# Patient Record
Sex: Female | Born: 1945 | Race: White | Hispanic: No | Marital: Married | Smoking: Never smoker
Health system: Southern US, Community
[De-identification: ages and names within clinical notes are randomized; demographics above are authoritative.]

## PROBLEM LIST (undated history)

## (undated) DIAGNOSIS — I10 Essential (primary) hypertension: Secondary | ICD-10-CM

## (undated) DIAGNOSIS — K5790 Diverticulosis of intestine, part unspecified, without perforation or abscess without bleeding: Secondary | ICD-10-CM

## (undated) DIAGNOSIS — A4902 Methicillin resistant Staphylococcus aureus infection, unspecified site: Secondary | ICD-10-CM

## (undated) DIAGNOSIS — M8580 Other specified disorders of bone density and structure, unspecified site: Secondary | ICD-10-CM

## (undated) HISTORY — DX: Methicillin resistant Staphylococcus aureus infection, unspecified site: A49.02

## (undated) HISTORY — DX: Essential (primary) hypertension: I10

## (undated) HISTORY — DX: Other specified disorders of bone density and structure, unspecified site: M85.80

## (undated) HISTORY — PX: HEMORRHOID SURGERY: SHX153

## (undated) HISTORY — DX: Diverticulosis of intestine, part unspecified, without perforation or abscess without bleeding: K57.90

---

## 1999-11-02 ENCOUNTER — Other Ambulatory Visit: Admission: RE | Admit: 1999-11-02 | Discharge: 1999-11-02 | Payer: Self-pay | Admitting: Gynecology

## 2001-10-02 ENCOUNTER — Other Ambulatory Visit: Admission: RE | Admit: 2001-10-02 | Discharge: 2001-10-02 | Payer: Self-pay | Admitting: Gynecology

## 2003-01-16 ENCOUNTER — Other Ambulatory Visit: Admission: RE | Admit: 2003-01-16 | Discharge: 2003-01-16 | Payer: Self-pay | Admitting: Gynecology

## 2004-08-16 ENCOUNTER — Other Ambulatory Visit: Admission: RE | Admit: 2004-08-16 | Discharge: 2004-08-16 | Payer: Self-pay | Admitting: Gynecology

## 2006-04-11 ENCOUNTER — Other Ambulatory Visit: Admission: RE | Admit: 2006-04-11 | Discharge: 2006-04-11 | Payer: Self-pay | Admitting: Gynecology

## 2011-05-23 ENCOUNTER — Ambulatory Visit (INDEPENDENT_AMBULATORY_CARE_PROVIDER_SITE_OTHER): Payer: BC Managed Care – PPO | Admitting: Internal Medicine

## 2011-05-23 ENCOUNTER — Telehealth: Payer: Self-pay | Admitting: Emergency Medicine

## 2011-05-23 ENCOUNTER — Encounter: Payer: Self-pay | Admitting: Internal Medicine

## 2011-05-23 ENCOUNTER — Other Ambulatory Visit: Payer: Self-pay | Admitting: Internal Medicine

## 2011-05-23 VITALS — BP 120/82 | HR 74 | Temp 97.2°F | Resp 14 | Ht 63.25 in | Wt 182.1 lb

## 2011-05-23 DIAGNOSIS — K649 Unspecified hemorrhoids: Secondary | ICD-10-CM | POA: Insufficient documentation

## 2011-05-23 DIAGNOSIS — K922 Gastrointestinal hemorrhage, unspecified: Secondary | ICD-10-CM

## 2011-05-23 LAB — CBC WITH DIFFERENTIAL/PLATELET
Basophils Absolute: 0 10*3/uL (ref 0.0–0.1)
Basophils Relative: 1 % (ref 0–1)
Eosinophils Absolute: 0.2 10*3/uL (ref 0.0–0.7)
Hemoglobin: 13.2 g/dL (ref 12.0–15.0)
MCH: 30.5 pg (ref 26.0–34.0)
MCHC: 32.6 g/dL (ref 30.0–36.0)
Monocytes Relative: 9 % (ref 3–12)
Neutrophils Relative %: 59 % (ref 43–77)
RDW: 14.4 % (ref 11.5–15.5)

## 2011-05-23 LAB — COMPREHENSIVE METABOLIC PANEL
AST: 18 U/L (ref 0–37)
Alkaline Phosphatase: 56 U/L (ref 39–117)
BUN: 19 mg/dL (ref 6–23)
Glucose, Bld: 89 mg/dL (ref 70–99)
Sodium: 136 mEq/L (ref 135–145)
Total Bilirubin: 0.3 mg/dL (ref 0.3–1.2)
Total Protein: 7.9 g/dL (ref 6.0–8.3)

## 2011-05-23 MED ORDER — HYDROCORTISONE ACETATE 30 MG RE SUPP: RECTAL | Status: DC

## 2011-05-23 NOTE — Progress Notes (Signed)
Subjective:     Patient ID: Robin Massey, female   DOB: [birth year], 65 y.o.   MRN: [id number]  HPI 1)First visit.  Former pt.  Healthy female with PMH of osteopenia and MRSA successfully treated with Doxycycline.  2)  She has been having trouble with hemorrhoids.  Bleeds about every 3rd day.  Tucks not helping.  Constipation a problem at times, she uses fiber.  Last colonoscopy 2003 pt reports no polyps.  ??Dr. Juanda Chance  No weight loss, no appetite change, no N/V     Allergies  Allergen Reactions  . Penicillins Other (See Comments)    Reaction as a child   Past Medical History  Diagnosis Date  . MRSA (methicillin resistant Staphylococcus aureus)   . Osteopenia    History reviewed. No pertinent past surgical history. History   Social History  . Marital Status: Married    Spouse Name: N/A    Number of Children: N/A  . Years of Education: N/A   Occupational History  . Not on file.   Social History Main Topics  . Smoking status: Never Smoker   . Smokeless tobacco: Never Used  . Alcohol Use: 0.5 oz/week    1 drink(s) per week  . Drug Use: No  . Sexually Active: Yes -- Female partner(s)   Other Topics Concern  . Not on file   Social History Narrative  . No narrative on file   Family History  Problem Relation Age of Onset  . Osteoporosis Mother   . Dementia Mother   . Heart disease Father     pacemaker heart valve replacement   There is no problem list on file for this patient.     Review of Systems See HPI    Objective:   Physical Exam  Constitutional: She appears well-developed and well-nourished.  Cardiovascular: Normal rate, regular rhythm and normal heart sounds.        No LE edema bilaterally  Pulmonary/Chest: Breath sounds normal.  Abdominal: She exhibits no distension. There is no tenderness.  Genitourinary: Rectal exam shows external hemorrhoid. Rectal exam shows no mass. Guaiac negative stool.       Assessment:     1) Hemorrhoids:  Will  give Proctocort suppository for the next two week.  I counseled pt. To make appt with her GI MD for earlier colonoscopy and discussion of treatment of hemorrhoids  2) GI bleed   I counseled pt. To make appt with her GI MD for earlier colonoscopy and discussion of treatment of hemorrhoids  3) H/O MRSA  4) Osteopenia      Plan:      1) Will give Proctocort suppository for the next two week.  I counseled pt. To make appt with her GI MD for earlier colonoscopy and discussion of treatment of hemorrhoids  2)  Will initiate GI Referral

## 2011-05-23 NOTE — Telephone Encounter (Signed)
Pt called, stated her prescription did not go through to [facility].  Script for [employer] as written in note per DDS called to [place], RPh at [facility], rn

## 2011-05-24 ENCOUNTER — Telehealth: Payer: Self-pay | Admitting: Internal Medicine

## 2011-05-24 ENCOUNTER — Encounter: Payer: Self-pay | Admitting: *Deleted

## 2011-05-24 ENCOUNTER — Encounter: Payer: Self-pay | Admitting: Emergency Medicine

## 2011-05-24 NOTE — Telephone Encounter (Signed)
Spoke with Selena Batten and gave her the appointment information. She will call patient. Letter mailed to patient.

## 2011-05-24 NOTE — Telephone Encounter (Signed)
Scheduled patient on 06/06/11 arrive at 1:45 PM for 2:00 PM OV with Dr. Juanda Chance. Line is busy for [employer]. Will try again later.

## 2011-05-25 ENCOUNTER — Encounter: Payer: Self-pay | Admitting: Emergency Medicine

## 2011-06-06 ENCOUNTER — Ambulatory Visit: Payer: BC Managed Care – PPO | Admitting: Internal Medicine

## 2011-06-12 ENCOUNTER — Encounter: Payer: Self-pay | Admitting: Internal Medicine

## 2011-06-12 DIAGNOSIS — M8580 Other specified disorders of bone density and structure, unspecified site: Secondary | ICD-10-CM | POA: Insufficient documentation

## 2011-06-12 DIAGNOSIS — E663 Overweight: Secondary | ICD-10-CM | POA: Insufficient documentation

## 2011-06-12 DIAGNOSIS — Z8614 Personal history of Methicillin resistant Staphylococcus aureus infection: Secondary | ICD-10-CM | POA: Insufficient documentation

## 2011-06-12 DIAGNOSIS — E785 Hyperlipidemia, unspecified: Secondary | ICD-10-CM | POA: Insufficient documentation

## 2011-06-13 ENCOUNTER — Encounter: Payer: Self-pay | Admitting: Internal Medicine

## 2011-06-13 DIAGNOSIS — M8580 Other specified disorders of bone density and structure, unspecified site: Secondary | ICD-10-CM | POA: Insufficient documentation

## 2011-06-17 ENCOUNTER — Encounter: Payer: Self-pay | Admitting: Internal Medicine

## 2011-06-23 ENCOUNTER — Ambulatory Visit: Payer: BC Managed Care – PPO | Admitting: Internal Medicine

## 2011-06-27 ENCOUNTER — Ambulatory Visit: Payer: BC Managed Care – PPO | Admitting: Internal Medicine

## 2011-08-05 ENCOUNTER — Encounter: Payer: Self-pay | Admitting: Internal Medicine

## 2011-08-19 ENCOUNTER — Other Ambulatory Visit (INDEPENDENT_AMBULATORY_CARE_PROVIDER_SITE_OTHER): Payer: BC Managed Care – PPO

## 2011-08-19 ENCOUNTER — Ambulatory Visit (INDEPENDENT_AMBULATORY_CARE_PROVIDER_SITE_OTHER): Payer: BC Managed Care – PPO | Admitting: Internal Medicine

## 2011-08-19 ENCOUNTER — Encounter: Payer: Self-pay | Admitting: Internal Medicine

## 2011-08-19 DIAGNOSIS — K625 Hemorrhage of anus and rectum: Secondary | ICD-10-CM

## 2011-08-19 DIAGNOSIS — K648 Other hemorrhoids: Secondary | ICD-10-CM

## 2011-08-19 DIAGNOSIS — K649 Unspecified hemorrhoids: Secondary | ICD-10-CM

## 2011-08-19 LAB — CBC WITH DIFFERENTIAL/PLATELET
Basophils Absolute: 0 10*3/uL (ref 0.0–0.1)
Basophils Relative: 0.8 % (ref 0.0–3.0)
Eosinophils Absolute: 0.2 10*3/uL (ref 0.0–0.7)
Lymphocytes Relative: 26.2 % (ref 12.0–46.0)
MCHC: 33.8 g/dL (ref 30.0–36.0)
Monocytes Relative: 7 % (ref 3.0–12.0)
Neutrophils Relative %: 62.7 % (ref 43.0–77.0)
RBC: 4.09 Mil/uL (ref 3.87–5.11)
WBC: 5.1 10*3/uL (ref 4.5–10.5)

## 2011-08-19 MED ORDER — PEG-KCL-NACL-NASULF-NA ASC-C 100 G PO SOLR: 1.0000 | Freq: Once | ORAL | Status: DC

## 2011-08-19 MED ORDER — HYDROCORTISONE ACE-PRAMOXINE 2.5-1 % RE CREA: TOPICAL_CREAM | Freq: Three times a day (TID) | RECTAL | Status: AC | PRN

## 2011-08-19 MED ORDER — HYDROCORTISONE ACETATE 25 MG RE SUPP: 25.0000 mg | Freq: Every day | RECTAL | Status: AC

## 2011-08-19 NOTE — Progress Notes (Signed)
Robin Massey [birth year] MRN [id number]    History of Present Illness:  This is a 65 year old white female with pain and small amounts of blood in the stool as well as in the water. This has been occurring intermittently for one year. She attributes bleeding to hemorrhoids. She had a screening colonoscopy in 2003 with findings of diverticulosis of the sigmoid colon. There is no family history of colon cancer. She has been somewhat constipated. She takes stool softeners.   Past Medical History  Diagnosis Date  . MRSA (methicillin resistant Staphylococcus aureus)   . Osteopenia   . Hemorrhoids   . Diverticulosis    History reviewed. No pertinent past surgical history.  reports that she has never smoked. She has never used smokeless tobacco. She reports that she drinks about .5 ounces of alcohol per week. She reports that she does not use illicit drugs. family history includes Dementia in her mother; Heart disease in her father; and Osteoporosis in her mother. Allergies  Allergen Reactions  . Penicillins Other (See Comments)    Reaction as a child        Review of Systems: Denies abdominal pain heartburn dysphagia shortness of breath or chest pain  The remainder of the 10 point ROS is negative except as outlined in H&P   Physical Exam: General appearance  Well developed, in no distress. Eyes- non icteric. HEENT nontraumatic, normocephalic. Mouth no lesions, tongue papillated, no cheilosis. Neck supple without adenopathy, thyroid not enlarged, no carotid bruits, no JVD. Lungs Clear to auscultation bilaterally. Cor normal S1, normal S2, regular rhythm, no murmur,  quiet precordium. Abdomen: Soft nontender abdomen with normal active bowel sounds. Mildly protuberant. Liver edge at costal margin. Rectal: And anoscopic exam reveals large external hemorrhoids with bright red blood at the entrance into the anal canal. Thrombosed hemorrhoids noted in the anal canal which is oozing  blood. There were no significant internal hemorrhoids. Extremities no pedal edema. Skin no lesions. Neurological alert and oriented x 3. Psychological normal mood and affect.  Assessment and Plan:  Problem #1 painless hematochezia almost certainly due to external hemorrhoids. There are thrombosed hemorrhoids in the anal canal. We will treat her conservatively with Anusol-HC suppositories at bedtime and Analpram cream 2.5% as well as fiber supplements and stool softeners. She is due for a recall colonoscopy to rule out additional lesions in her colon. We will reexamine her in 6 weeks and decide if a surgical hemorrhoidectomy is indicated. We are checking a CBC today.   08/19/2011 [name]

## 2011-08-19 NOTE — Patient Instructions (Signed)
You have been scheduled for a colonoscopy. Please follow written instructions given to you at your visit today.  Please pick up your Moviprep kit at the pharmacy within the next 2-3 days. We have sent the following medications to your pharmacy for you to pick up at your convenience: Ansuol [facility] Analpram Your physician has requested that you go to the basement for the following lab work before leaving today: CBC CC: Dr Constance Goltz

## 2011-08-22 ENCOUNTER — Telehealth: Payer: Self-pay

## 2011-08-22 NOTE — Telephone Encounter (Signed)
Pt aware.

## 2011-08-22 NOTE — Telephone Encounter (Signed)
Message copied by Michele Mcalpine on Mon Aug 22, 2011  8:18 AM ------      Message from: Hart Carwin      Created: Sat Aug 20, 2011  3:21 PM       Please call pt with normal CBC

## 2011-09-22 ENCOUNTER — Ambulatory Visit (AMBULATORY_SURGERY_CENTER): Payer: BC Managed Care – PPO | Admitting: Internal Medicine

## 2011-09-22 ENCOUNTER — Encounter: Payer: Self-pay | Admitting: Internal Medicine

## 2011-09-22 DIAGNOSIS — Z1211 Encounter for screening for malignant neoplasm of colon: Secondary | ICD-10-CM

## 2011-09-22 DIAGNOSIS — K625 Hemorrhage of anus and rectum: Secondary | ICD-10-CM

## 2011-09-22 DIAGNOSIS — K649 Unspecified hemorrhoids: Secondary | ICD-10-CM

## 2011-09-22 DIAGNOSIS — K648 Other hemorrhoids: Secondary | ICD-10-CM

## 2011-09-22 MED ORDER — SODIUM CHLORIDE 0.9 % IV SOLN: 500.0000 mL | INTRAVENOUS | Status: DC

## 2011-09-22 NOTE — Patient Instructions (Signed)
Please refer to your blue and neon green sheets for instructions regarding diet and activity for the rest of today.  You may resume your medications as you would normally take them.   Diverticulosis Diverticulosis is a common condition that develops when small pouches (diverticula) form in the wall of the colon. The risk of diverticulosis increases with age. It happens more often in people who eat a low-fiber diet. Most individuals with diverticulosis have no symptoms. Those individuals with symptoms usually experience abdominal pain, constipation, or loose stools (diarrhea). HOME CARE INSTRUCTIONS   Increase the amount of fiber in your diet as directed by your caregiver or dietician. This may reduce symptoms of diverticulosis.   Your caregiver may recommend taking a dietary fiber supplement.   Drink at least 6 to 8 glasses of water each day to prevent constipation.   Try not to strain when you have a bowel movement.   Your caregiver may recommend avoiding nuts and seeds to prevent complications, although this is still an uncertain benefit.   Only take over-the-counter or prescription medicines for pain, discomfort, or fever as directed by your caregiver.  FOODS WITH HIGH FIBER CONTENT INCLUDE:  Fruits. Apple, peach, pear, tangerine, raisins, prunes.   Vegetables. Brussels sprouts, asparagus, broccoli, cabbage, carrot, cauliflower, romaine lettuce, spinach, summer squash, tomato, winter squash, zucchini.   Starchy Vegetables. Baked beans, kidney beans, lima beans, split peas, lentils, potatoes (with skin).   Grains. Whole wheat bread, brown rice, bran flake cereal, plain oatmeal, white rice, shredded wheat, bran muffins.  SEEK IMMEDIATE MEDICAL CARE IF:   You develop increasing pain or severe bloating.   You have an oral temperature above 102 F (38.9 C), not controlled by medicine.   You develop vomiting or bowel movements that are bloody or black.  Document Released: 07/28/2004  Document Revised: 07/13/2011 Document Reviewed: 03/31/2010 [facility] Patient Information 2012 [place], [place].  Hemorrhoids Hemorrhoids are veins in the rectum that get big. These veins can get blocked. Blocked veins become puffy (swollen) and painful. HOME CARE  Eat more fiber.   Drink enough fluid to keep your pee (urine) clear or pale yellow.   Exercise often.   Avoid straining to poop (bowel movement).   Keep the butt area dry and clean.   Only take medicine as told by your doctor.  If your hemorrhoids are puffy and painful:  Take a warm bath for 20 to 30 minutes. Do this 3 to 4 times a day.   Place ice packs on the area. Use the ice packs between the baths.   Put ice in a plastic bag.   Place a towel between your skin and the bag.   Leave the ice on for 15 to 20 minutes, 3 to 4 times a day.   Do not use a donut-shaped pillow. Do not sit on the toilet for a long time.   Go to the bathroom when your body has the urge to poop. This is so you do not strain as much to poop.  GET HELP RIGHT AWAY IF:   You have increasing pain that is not controlled with medicine.   You have uncontrolled bleeding.   You cannot poop.   You have pain or puffiness outside the area of the hemorrhoids.   You have chills.   You have a temperature by mouth above 102 F (38.9 C), not controlled by medicine.  MAKE SURE YOU:   Understand these instructions.   Will watch your condition.   Will  get help right away if you are not doing well or get worse.  Document Released: 08/09/2008 Document Revised: 07/13/2011 Document Reviewed: 08/09/2008 [facility] Patient Information 2012 [place], [place].

## 2011-09-23 ENCOUNTER — Telehealth: Payer: Self-pay | Admitting: *Deleted

## 2011-09-23 NOTE — Telephone Encounter (Signed)

## 2011-09-29 ENCOUNTER — Telehealth: Payer: Self-pay | Admitting: Internal Medicine

## 2011-09-29 NOTE — Telephone Encounter (Signed)
Spoke with patient and she is going to her PCP on Tuesday. She wants to talk with her PCP about a Careers adviser. She will call us back after this to schedule.

## 2011-09-29 NOTE — Telephone Encounter (Signed)
Spoke with patient and she states she had her colonoscopy last week. She is using the suppositories for her hemorrhoids but they are still bothering her. She wants to know if she should she see Dr. Juanda Chance or surgeon about them. Please, advise.

## 2011-09-29 NOTE — Telephone Encounter (Signed)
She should see a surgeon for her hems because they are mostly external

## 2011-09-29 NOTE — Telephone Encounter (Signed)
Left a message for patient to call me. 

## 2011-10-04 ENCOUNTER — Ambulatory Visit (INDEPENDENT_AMBULATORY_CARE_PROVIDER_SITE_OTHER): Payer: BC Managed Care – PPO | Admitting: Internal Medicine

## 2011-10-04 ENCOUNTER — Encounter: Payer: Self-pay | Admitting: Internal Medicine

## 2011-10-04 ENCOUNTER — Ambulatory Visit (HOSPITAL_BASED_OUTPATIENT_CLINIC_OR_DEPARTMENT_OTHER)
Admission: RE | Admit: 2011-10-04 | Discharge: 2011-10-04 | Disposition: A | Discharge status: Home / Self Care | Payer: BC Managed Care – PPO | Source: Ambulatory Visit | Attending: Internal Medicine | Admitting: Internal Medicine

## 2011-10-04 DIAGNOSIS — Z23 Encounter for immunization: Secondary | ICD-10-CM

## 2011-10-04 DIAGNOSIS — R05 Cough: Secondary | ICD-10-CM

## 2011-10-04 DIAGNOSIS — B359 Dermatophytosis, unspecified: Secondary | ICD-10-CM | POA: Insufficient documentation

## 2011-10-04 DIAGNOSIS — Z1151 Encounter for screening for human papillomavirus (HPV): Secondary | ICD-10-CM

## 2011-10-04 DIAGNOSIS — Z01419 Encounter for gynecological examination (general) (routine) without abnormal findings: Secondary | ICD-10-CM

## 2011-10-04 DIAGNOSIS — M8580 Other specified disorders of bone density and structure, unspecified site: Secondary | ICD-10-CM

## 2011-10-04 DIAGNOSIS — K5790 Diverticulosis of intestine, part unspecified, without perforation or abscess without bleeding: Secondary | ICD-10-CM | POA: Insufficient documentation

## 2011-10-04 DIAGNOSIS — Z1272 Encounter for screening for malignant neoplasm of vagina: Secondary | ICD-10-CM

## 2011-10-04 DIAGNOSIS — M899 Disorder of bone, unspecified: Secondary | ICD-10-CM

## 2011-10-04 DIAGNOSIS — R059 Cough, unspecified: Secondary | ICD-10-CM | POA: Insufficient documentation

## 2011-10-04 DIAGNOSIS — K649 Unspecified hemorrhoids: Secondary | ICD-10-CM

## 2011-10-04 DIAGNOSIS — E785 Hyperlipidemia, unspecified: Secondary | ICD-10-CM

## 2011-10-04 DIAGNOSIS — Z139 Encounter for screening, unspecified: Secondary | ICD-10-CM

## 2011-10-04 DIAGNOSIS — K5730 Diverticulosis of large intestine without perforation or abscess without bleeding: Secondary | ICD-10-CM

## 2011-10-04 DIAGNOSIS — Z0000 Encounter for general adult medical examination without abnormal findings: Secondary | ICD-10-CM

## 2011-10-04 DIAGNOSIS — J309 Allergic rhinitis, unspecified: Secondary | ICD-10-CM | POA: Insufficient documentation

## 2011-10-04 LAB — POCT URINALYSIS DIPSTICK
Bilirubin, UA: NEGATIVE
Glucose, UA: NEGATIVE
Ketones, UA: NEGATIVE
Nitrite, UA: NEGATIVE
Spec Grav, UA: <=1.005
Urobilinogen, UA: NEGATIVE

## 2011-10-04 LAB — COMPREHENSIVE METABOLIC PANEL
Albumin: 4 g/dL (ref 3.5–5.2)
CO2: 26 mEq/L (ref 19–32)
Calcium: 9.1 mg/dL (ref 8.4–10.5)
Chloride: 102 mEq/L (ref 96–112)
Glucose, Bld: 88 mg/dL (ref 70–99)
Potassium: 4.3 mEq/L (ref 3.5–5.3)
Sodium: 137 mEq/L (ref 135–145)
Total Protein: 7.9 g/dL (ref 6.0–8.3)

## 2011-10-04 LAB — CBC WITH DIFFERENTIAL/PLATELET
Eosinophils Absolute: 0.3 10*3/uL (ref 0.0–0.7)
Hemoglobin: 13.4 g/dL (ref 12.0–15.0)
Lymphs Abs: 1.6 10*3/uL (ref 0.7–4.0)
MCH: 31.5 pg (ref 26.0–34.0)
Monocytes Relative: 6 % (ref 3–12)
Neutro Abs: 2.9 10*3/uL (ref 1.7–7.7)
Neutrophils Relative %: 56 % (ref 43–77)
RBC: 4.26 MIL/uL (ref 3.87–5.11)

## 2011-10-04 LAB — TSH: TSH: 1.919 u[IU]/mL (ref 0.350–4.500)

## 2011-10-04 LAB — LIPID PANEL: Cholesterol: 195 mg/dL (ref 0–200)

## 2011-10-04 MED ORDER — NYSTATIN-TRIAMCINOLONE 100000-0.1 UNIT/GM-% EX OINT: TOPICAL_OINTMENT | Freq: Two times a day (BID) | CUTANEOUS | Status: AC

## 2011-10-04 MED ORDER — FLUTICASONE PROPIONATE 50 MCG/ACT NA SUSP: 1.0000 | Freq: Every day | NASAL | Status: DC

## 2011-10-04 NOTE — Patient Instructions (Signed)
To X-ray now for mammogram and Chest x ray  Use antifungal creme under breast 2 times daily  claritin D once a day and flonase nasal spray once a day  See me in 3 months

## 2011-10-04 NOTE — Progress Notes (Signed)
Subjective:    Patient ID: Robin Massey, female    DOB: 07-22-1946, 65 y.o.   MRN: [id number]  HPI  Robin Massey is here for comprehensive eval.  Overall doing well.  She does have an itchy rash under both breasts.    She also reports an intermittant cough for the past 4 weeks off and on.  She does feel a lot of posterior pharyngeal drainange.   She denies wheezing or sob or chest pain  She had recent colonosocopy with Dr. Juanda Chance which revealed hemorrhoids and diverticulosis.  She would like to talk to a surgeon as her hemorrhoids still bleed off and on.  She has no abd painor bleedingnow  Allergies  Allergen Reactions  . Penicillins Other (See Comments)    Reaction as a child   Past Medical History  Diagnosis Date  . MRSA (methicillin resistant Staphylococcus aureus)   . Osteopenia   . Hemorrhoids   . Diverticulosis    No past surgical history on file. History   Social History  . Marital Status: Married    Spouse Name: N/A    Number of Children: 2  . Years of Education: N/A   Occupational History  . retired    Social History Main Topics  . Smoking status: Never Smoker   . Smokeless tobacco: Never Used  . Alcohol Use: 0.5 oz/week    1 drink(s) per week     occasionally  . Drug Use: No  . Sexually Active: Yes -- Female partner(s)   Other Topics Concern  . Not on file   Social History Narrative  . No narrative on file   Family History  Problem Relation Age of Onset  . Osteoporosis Mother   . Dementia Mother   . Heart disease Father     pacemaker heart valve replacement   Patient Active Problem List  Diagnoses  . Hemorrhoids  . Other and unspecified hyperlipidemia  . Overweight  . Hx MRSA infection  . Osteopenia   Current Outpatient Prescriptions on File Prior to Visit  Medication Sig Dispense Refill  . b complex vitamins capsule Take 1 capsule by mouth daily.        . Calcium Carbonate-Vitamin D (CALTRATE 600+D) 600-400 MG-UNIT per tablet Take 1  tablet by mouth daily.        Marland Kitchen co-enzyme Q-10 30 MG capsule Take 30 mg by mouth daily.        . Cyanocobalamin (VITAMIN B-12 CR PO) Take 1 tablet by mouth daily.        Marland Kitchen GRAPE SEED CR PO Take 1 tablet by mouth daily.        . hydrocortisone (ANUSOL-HC) 25 MG suppository Place 1 suppository (25 mg total) rectally at bedtime.  12 suppository  1  . hydrocortisone-pramoxine (ANALPRAM-HC) 2.5-1 % rectal cream       . Multiple Vitamin (MULTIVITAMIN) tablet Take 1 tablet by mouth daily.        Marland Kitchen VITAMIN D, CHOLECALCIFEROL, PO Take 1 tablet by mouth daily.        . vitamin E 400 UNIT capsule Take 400 Units by mouth daily.            Review of Systems    No dyspepsia, no dysuria or urgency or incontinence See HPI Objective:   Physical Exam Physical Exam  Vital signs and nursing note reviewed  Constitutional: She is oriented to person, place, and time. She appears well-developed and well-nourished. She is cooperative.  HENT:  Head:  Normocephalic and atraumatic.  Right Ear: Tympanic membrane normal.  Left Ear: Tympanic membrane normal.  Nose: Nose normal.  Mouth/Throat: Oropharynx is clear and moist and mucous membranes are normal. No oropharyngeal exudate or posterior oropharyngeal erythema.  Eyes: Conjunctivae and EOM are normal. Pupils are equal, round, and reactive to light.  Neck: Neck supple. No JVD present. Carotid bruit is not present. No mass and no thyromegaly present.  Cardiovascular: Regular rhythm, normal heart sounds, intact distal pulses and normal pulses.  Exam reveals no gallop and no friction rub.   No murmur heard. Pulses:      Dorsalis pedis pulses are 2+ on the right side, and 2+ on the left side.  Pulmonary/Chest: Breath sounds normal. She has no wheezes. She has no rhonchi. She has no rales. Right breast exhibits no mass, no nipple discharge and no skin change. Left breast exhibits no mass, no nipple discharge and no skin change.  Abdominal: Soft. Bowel sounds are  normal. She exhibits no distension and no mass. There is no hepatosplenomegaly. There is no tenderness. There is no CVA tenderness.  Genitourinary: Rectum normal, vagina normal and uterus normal. Rectal exam shows no mass. Guaiac negative stool. No labial fusion. There is no lesion on the right labia. There is no lesion on the left labia. Cervix exhibits no motion tenderness. Right adnexum displays no mass, no tenderness and no fullness. Left adnexum displays no mass, no tenderness and no fullness. No erythema around the vagina.  Musculoskeletal:       No active synovitis to any joint.    Lymphadenopathy:       Right cervical: No superficial cervical adenopathy present.      Left cervical: No superficial cervical adenopathy present.       Right axillary: No pectoral and no lateral adenopathy present.       Left axillary: No pectoral and no lateral adenopathy present.      Right: No inguinal adenopathy present.       Left: No inguinal adenopathy present.  Neurological: She is alert and oriented to person, place, and time. She has normal strength and normal reflexes. No cranial nerve deficit or sensory deficit. She displays a negative Romberg sign. Coordination and gait normal.  Skin: Skin is warm and dry. No abrasion, no bruising, no ecchymosis and no rash noted. No cyanosis. Nails show no clubbing.  Psychiatric: She has a normal mood and affect. Her speech is normal and behavior is normal.          Assessment & Plan:  1)  Health Maintenance  Flu and pneumonia vaccine today.  She will get mammmogram today.  See scanned health matinenance sheet.  EKG SR no acute changes.   Dash diet given 2)  Cough  :  May be due to allergic rhinitis.  Will try Claritin D and Flonase once daily 3)  Tinea:   Mycolog creme bid 4)  Hemorrhoids  Will refer to Dr. Abbey Chatters 5) hyperlipidemia  Check all labs today 6)  Osteopenia  Calcium and vit D  Check Vitamin D today        Assessment & Plan:

## 2011-10-10 ENCOUNTER — Telehealth: Payer: Self-pay | Admitting: Internal Medicine

## 2011-10-10 ENCOUNTER — Encounter: Payer: Self-pay | Admitting: Emergency Medicine

## 2011-10-10 NOTE — Telephone Encounter (Signed)
Spoke with pt .  She reports cough is nearly gone.  I also informed of pap smear results.    She is to call if cough returns

## 2011-10-14 ENCOUNTER — Ambulatory Visit (HOSPITAL_BASED_OUTPATIENT_CLINIC_OR_DEPARTMENT_OTHER)
Admission: RE | Admit: 2011-10-14 | Discharge: 2011-10-14 | Disposition: A | Discharge status: Home / Self Care | Payer: BC Managed Care – PPO | Source: Ambulatory Visit | Attending: Internal Medicine | Admitting: Internal Medicine

## 2011-10-14 DIAGNOSIS — Z1231 Encounter for screening mammogram for malignant neoplasm of breast: Secondary | ICD-10-CM | POA: Insufficient documentation

## 2011-10-14 DIAGNOSIS — Z139 Encounter for screening, unspecified: Secondary | ICD-10-CM

## 2011-11-01 ENCOUNTER — Ambulatory Visit (INDEPENDENT_AMBULATORY_CARE_PROVIDER_SITE_OTHER): Payer: BC Managed Care – PPO | Admitting: General Surgery

## 2011-11-01 ENCOUNTER — Encounter (INDEPENDENT_AMBULATORY_CARE_PROVIDER_SITE_OTHER): Payer: Self-pay | Admitting: General Surgery

## 2011-11-01 VITALS — BP 152/94 | HR 72 | Temp 97.8°F | Resp 18 | Ht 63.0 in | Wt 183.8 lb

## 2011-11-01 DIAGNOSIS — K648 Other hemorrhoids: Secondary | ICD-10-CM

## 2011-11-01 NOTE — Patient Instructions (Signed)
Call if you have further problems with your hemorrhoids.

## 2011-11-01 NOTE — Progress Notes (Signed)
Patient ID: Robin Massey, female   DOB: Dec 21, 1945, 65 y.o.   MRN: [id number]  Chief Complaint  Patient presents with  . Other    new pt- eval hems    HPI Robin Massey is a 65 y.o. female.   HPI  She is referred by Dr. Levon Hedger for evaluation of bleeding internal hemorrhoids. This is been documented by Dr. Juanda Chance in the past.  She has been taking some stool softeners and since that time has not been constipated has not had any further problems with her hemorrhoids. Sometimes she states the prolapse. Past Medical History  Diagnosis Date  . MRSA (methicillin resistant Staphylococcus aureus)   . Osteopenia   . Hemorrhoids   . Diverticulosis     Past Surgical History  Procedure Date  . Hemorrhoid surgery     Family History  Problem Relation Age of Onset  . Osteoporosis Mother   . Dementia Mother   . Heart disease Father     pacemaker heart valve replacement    Social History History  Substance Use Topics  . Smoking status: Never Smoker   . Smokeless tobacco: Never Used  . Alcohol Use: No     occasionally    Allergies  Allergen Reactions  . Penicillins Other (See Comments)    Reaction as a child    Current Outpatient Prescriptions  Medication Sig Dispense Refill  . aspirin EC 81 MG tablet Take 81 mg by mouth daily.        Marland Kitchen b complex vitamins capsule Take 1 capsule by mouth daily.        . Calcium Carbonate-Vitamin D (CALTRATE 600+D) 600-400 MG-UNIT per tablet Take 1 tablet by mouth daily.        Marland Kitchen co-enzyme Q-10 30 MG capsule Take 30 mg by mouth daily.        . Cyanocobalamin (VITAMIN B-12 CR PO) Take 1 tablet by mouth daily.        . fluticasone (FLONASE) 50 MCG/ACT nasal spray Place 1 spray into the nose daily.  1 g  1  . GRAPE SEED CR PO Take 1 tablet by mouth daily.        . hydrocortisone (ANUSOL-HC) 25 MG suppository Place 1 suppository (25 mg total) rectally at bedtime.  12 suppository  1  . Multiple Vitamin (MULTIVITAMIN) tablet Take  1 tablet by mouth daily.        Marland Kitchen nystatin-triamcinolone ointment (MYCOLOG) Apply topically 2 (two) times daily.  30 g  0  . VITAMIN D, CHOLECALCIFEROL, PO Take 1 tablet by mouth daily.        . vitamin E 400 UNIT capsule Take 800 Units by mouth daily.       . hydrocortisone-pramoxine (ANALPRAM-HC) 2.5-1 % rectal cream         Review of Systems Review of Systems  Gastrointestinal:       Intermittent constipation but none recently. No recent rectal bleeding    Blood pressure 152/94, pulse 72, temperature 97.8 F (36.6 C), temperature source Temporal, resp. rate 18, height 5\' 3"  (1.6 m), weight 183 lb 12.8 oz (83.371 kg).  Physical Exam Physical Exam  Constitutional: No distress.       Overweight   Genitourinary:       Multiple external perianal skin tags were seen. No fissures. No suspicious skin lesions.  No palpable masses or blood on digital rectal exam.  Anoscopy-moderate sized internal hemorrhoid right posterior position; small internal hemorrhoids right anterior position  in left lateral position. No blood.    Data Reviewed Dr. Regino Schultze notes.  Assessment    Bleeding and intermittently prolapsing internal hemorrhoids-no current problems at this time since her constipation has resolved. Constipation is certainly the stimulus that leads to complications from her internal hemorrhoids.    Plan    I do not feel she needs any intervention at this time since she is asymptomatic. Continue to avoid constipation. I told her to call she had recurrent prolapse or bleeding and we could try an in office treatment.       [name] 11/01/2011, 2:02 PM

## 2012-02-21 ENCOUNTER — Telehealth: Payer: Self-pay | Admitting: Internal Medicine

## 2012-02-21 NOTE — Telephone Encounter (Signed)
Please call pt and let her know that I received a letter from her eye doctor and I wouuld  Like to see her in office for office visit.,  Bp check and blood work  Message back with her appointment date  Thanks

## 2012-02-21 NOTE — Telephone Encounter (Signed)
Spoke with Robin Massey.  Scheduled appt Thurs 4/25 @ 945am per her request.  Advised her to come fasting for labs, she is agreeable.

## 2012-03-08 ENCOUNTER — Encounter: Payer: Self-pay | Admitting: Internal Medicine

## 2012-03-08 ENCOUNTER — Ambulatory Visit (INDEPENDENT_AMBULATORY_CARE_PROVIDER_SITE_OTHER): Payer: BC Managed Care – PPO | Admitting: Internal Medicine

## 2012-03-08 VITALS — BP 146/72 | HR 75 | Temp 97.7°F | Resp 16 | Ht 64.0 in | Wt 184.0 lb

## 2012-03-08 DIAGNOSIS — I10 Essential (primary) hypertension: Secondary | ICD-10-CM

## 2012-03-08 MED ORDER — AMLODIPINE BESYLATE 5 MG PO TABS: 5.0000 mg | ORAL_TABLET | Freq: Every day | ORAL | Status: DC

## 2012-03-08 NOTE — Patient Instructions (Signed)
Take Bp meds daily    See me in 2 mlonths

## 2012-03-08 NOTE — Progress Notes (Signed)
Subjective:    Patient ID: Robin Massey, female    DOB: [birth year], 66 y.o.   MRN: [id number]  HPI  Robin Massey is here for follow up for BP.  She is asymptomatic.  Se BP at Robin Massey office  SBP  152  No chest pain, no SOB, no Le edema, no headache  Allergies  Allergen Reactions  . Penicillins Other (See Comments)    Reaction as a child   Past Medical History  Diagnosis Date  . MRSA (methicillin resistant Staphylococcus aureus)   . Osteopenia   . Hemorrhoids   . Diverticulosis    Past Surgical History  Procedure Date  . Hemorrhoid surgery    History   Social History  . Marital Status: Married    Spouse Name: N/A    Number of Children: 2  . Years of Education: N/A   Occupational History  . retired    Social History Main Topics  . Smoking status: Never Smoker   . Smokeless tobacco: Never Used  . Alcohol Use: No     occasionally  . Drug Use: No  . Sexually Active: Yes -- Female partner(s)   Other Topics Concern  . Not on file   Social History Narrative  . No narrative on file   Family History  Problem Relation Age of Onset  . Osteoporosis Mother   . Dementia Mother   . Heart disease Father     pacemaker heart valve replacement   Patient Active Problem List  Diagnoses  . Hemorrhoids  . Other and unspecified hyperlipidemia  . Overweight  . Hx MRSA infection  . Osteopenia  . Tinea  . Allergic rhinitis  . Diverticulosis  . Essential hypertension, benign   Current Outpatient Prescriptions on File Prior to Visit  Medication Sig Dispense Refill  . aspirin EC 81 MG tablet Take 81 mg by mouth daily.        Marland Kitchen b complex vitamins capsule Take 1 capsule by mouth daily.        . Calcium Carbonate-Vitamin D (CALTRATE 600+D) 600-400 MG-UNIT per tablet Take 1 tablet by mouth daily.        Marland Kitchen co-enzyme Q-10 30 MG capsule Take 30 mg by mouth daily.        . Cyanocobalamin (VITAMIN B-12 CR PO) Take 1 tablet by mouth daily.        . fluticasone (FLONASE) 50  MCG/ACT nasal spray Place 1 spray into the nose daily.  1 g  1  . GRAPE SEED CR PO Take 1 tablet by mouth daily.        . hydrocortisone (ANUSOL-HC) 25 MG suppository Place 1 suppository (25 mg total) rectally at bedtime.  12 suppository  1  . hydrocortisone-pramoxine (ANALPRAM-HC) 2.5-1 % rectal cream       . Multiple Vitamin (MULTIVITAMIN) tablet Take 1 tablet by mouth daily.        Marland Kitchen nystatin-triamcinolone ointment (MYCOLOG) Apply topically 2 (two) times daily.  30 g  0  . VITAMIN D, CHOLECALCIFEROL, PO Take 1 tablet by mouth daily.        . vitamin E 400 UNIT capsule Take 800 Units by mouth daily.       Marland Kitchen amLODipine (NORVASC) 5 MG tablet Take 1 tablet (5 mg total) by mouth daily.  90 tablet  3      Review of Systems See HPI    Objective:   Physical Exam  Physical Exam  Nursing note and vitals reviewed.  Constitutional: She is oriented to person, place, and time. She appears well-developed and well-nourished.  HENT:  Head: Normocephalic and atraumatic.  Cardiovascular: Normal rate and regular rhythm. Exam reveals no gallop and no friction rub.  No murmur heard.  Pulmonary/Chest: Breath sounds normal. She has no wheezes. She has no rales.  Neurological: She is alert and oriented to person, place, and time.  Skin: Skin is warm and dry.  Psychiatric: She has a normal mood and affect. Her behavior is normal.       Assessment & Plan:  1)  New onset HTN;  Will begin amlodipine 5 mg daily.  Recheck 2 months 2)  Mild hyperlipidemia  Dash diet

## 2012-05-08 ENCOUNTER — Encounter: Payer: Self-pay | Admitting: Internal Medicine

## 2012-05-08 ENCOUNTER — Telehealth: Payer: Self-pay | Admitting: Internal Medicine

## 2012-05-08 ENCOUNTER — Ambulatory Visit (INDEPENDENT_AMBULATORY_CARE_PROVIDER_SITE_OTHER): Payer: BC Managed Care – PPO | Admitting: Internal Medicine

## 2012-05-08 VITALS — BP 118/70 | HR 68 | Temp 97.7°F | Resp 16 | Ht 63.0 in | Wt 180.0 lb

## 2012-05-08 DIAGNOSIS — I10 Essential (primary) hypertension: Secondary | ICD-10-CM

## 2012-05-08 DIAGNOSIS — E663 Overweight: Secondary | ICD-10-CM

## 2012-05-08 DIAGNOSIS — E785 Hyperlipidemia, unspecified: Secondary | ICD-10-CM

## 2012-05-08 MED ORDER — AMLODIPINE BESYLATE 5 MG PO TABS: 5.0000 mg | ORAL_TABLET | Freq: Every day | ORAL | Status: DC

## 2012-05-08 NOTE — Patient Instructions (Addendum)
Continue medicine daily

## 2012-05-08 NOTE — Progress Notes (Signed)
Subjective:    Patient ID: Robin Massey, female    DOB: 09-29-1946, 66 y.o.   MRN: [id number]  HPI Junious Dresser is here for follow up after initiating Norvasc 5 mg for HTN. She is tolerating well and no LE edema.  No headache no chest pain    Allergies  Allergen Reactions  . Penicillins Other (See Comments)    Reaction as a child   Past Medical History  Diagnosis Date  . MRSA (methicillin resistant Staphylococcus aureus)   . Osteopenia   . Hemorrhoids   . Diverticulosis    Past Surgical History  Procedure Date  . Hemorrhoid surgery    History   Social History  . Marital Status: Married    Spouse Name: N/A    Number of Children: 2  . Years of Education: N/A   Occupational History  . retired    Social History Main Topics  . Smoking status: Never Smoker   . Smokeless tobacco: Never Used  . Alcohol Use: No     occasionally  . Drug Use: No  . Sexually Active: Yes -- Female partner(s)   Other Topics Concern  . Not on file   Social History Narrative  . No narrative on file   Family History  Problem Relation Age of Onset  . Osteoporosis Mother   . Dementia Mother   . Heart disease Father     pacemaker heart valve replacement   Patient Active Problem List  Diagnosis  . Hemorrhoids  . Other and unspecified hyperlipidemia  . Overweight  . Hx MRSA infection  . Osteopenia  . Tinea  . Allergic rhinitis  . Diverticulosis  . Essential hypertension, benign   Current Outpatient Prescriptions on File Prior to Visit  Medication Sig Dispense Refill  . amLODipine (NORVASC) 5 MG tablet Take 1 tablet (5 mg total) by mouth daily.  90 tablet  3  . aspirin EC 81 MG tablet Take 81 mg by mouth daily.        Marland Kitchen b complex vitamins capsule Take 1 capsule by mouth daily.        . Calcium Carbonate-Vitamin D (CALTRATE 600+D) 600-400 MG-UNIT per tablet Take 1 tablet by mouth daily.        Marland Kitchen co-enzyme Q-10 30 MG capsule Take 30 mg by mouth daily.        . Cyanocobalamin (VITAMIN  B-12 CR PO) Take 1 tablet by mouth daily.        . fluticasone (FLONASE) 50 MCG/ACT nasal spray Place 1 spray into the nose daily.  1 g  1  . GRAPE SEED CR PO Take 1 tablet by mouth daily.        . hydrocortisone (ANUSOL-HC) 25 MG suppository Place 1 suppository (25 mg total) rectally at bedtime.  12 suppository  1  . hydrocortisone-pramoxine (ANALPRAM-HC) 2.5-1 % rectal cream       . Multiple Vitamin (MULTIVITAMIN) tablet Take 1 tablet by mouth daily.        Marland Kitchen nystatin-triamcinolone ointment (MYCOLOG) Apply topically 2 (two) times daily.  30 g  0  . VITAMIN D, CHOLECALCIFEROL, PO Take 1 tablet by mouth daily.        . vitamin E 400 UNIT capsule Take 800 Units by mouth daily.           Review of Systems    see HPI Objective:   Physical Exam Physical Exam  Nursing note and vitals reviewed.  Constitutional: She is oriented to person, place,  and time. She appears well-developed and well-nourished.  HENT:  Head: Normocephalic and atraumatic.  Cardiovascular: Normal rate and regular rhythm. Exam reveals no gallop and no friction rub.  No murmur heard.  Pulmonary/Chest: Breath sounds normal. She has no wheezes. She has no rales.  Neurological: She is alert and oriented to person, place, and time.  Skin: Skin is warm and dry.  Psychiatric: She has a normal mood and affect. Her behavior is normal.             Assessment & Plan:  HTNl;  Well controlled on low dose Norvasc 5 mg.  Will re-order

## 2012-05-08 NOTE — Telephone Encounter (Signed)
Called and want 4 months of meds

## 2012-05-16 ENCOUNTER — Other Ambulatory Visit: Payer: Self-pay | Admitting: *Deleted

## 2012-05-16 DIAGNOSIS — I10 Essential (primary) hypertension: Secondary | ICD-10-CM

## 2012-05-16 MED ORDER — AMLODIPINE BESYLATE 5 MG PO TABS: 5.0000 mg | ORAL_TABLET | Freq: Every day | ORAL | Status: DC

## 2012-05-16 NOTE — Telephone Encounter (Signed)
Pt wants a 4 month supply because she can get it cheaper than just a one month supply.  [facility] this special

## 2012-08-31 ENCOUNTER — Other Ambulatory Visit: Payer: Self-pay | Admitting: Internal Medicine

## 2012-08-31 DIAGNOSIS — Z1231 Encounter for screening mammogram for malignant neoplasm of breast: Secondary | ICD-10-CM

## 2012-10-08 ENCOUNTER — Telehealth: Payer: Self-pay | Admitting: *Deleted

## 2012-10-08 DIAGNOSIS — Z0000 Encounter for general adult medical examination without abnormal findings: Secondary | ICD-10-CM

## 2012-10-08 LAB — CBC WITH DIFFERENTIAL/PLATELET
Basophils Absolute: 0 10*3/uL (ref 0.0–0.1)
Basophils Relative: 0 % (ref 0–1)
HCT: 38.1 % (ref 36.0–46.0)
Lymphocytes Relative: 29 % (ref 12–46)
MCHC: 34.6 g/dL (ref 30.0–36.0)
Monocytes Absolute: 0.5 10*3/uL (ref 0.1–1.0)
Neutro Abs: 3.4 10*3/uL (ref 1.7–7.7)
Neutrophils Relative %: 59 % (ref 43–77)
RDW: 13.7 % (ref 11.5–15.5)
WBC: 5.7 10*3/uL (ref 4.0–10.5)

## 2012-10-08 LAB — LIPID PANEL
Cholesterol: 208 mg/dL — ABNORMAL HIGH (ref 0–200)
HDL: 56 mg/dL (ref >39)
LDL Cholesterol: 115 mg/dL — ABNORMAL HIGH (ref 0–99)
Triglycerides: 187 mg/dL — ABNORMAL HIGH (ref <150)

## 2012-10-08 LAB — COMPREHENSIVE METABOLIC PANEL
Albumin: 4.3 g/dL (ref 3.5–5.2)
Alkaline Phosphatase: 62 U/L (ref 39–117)
BUN: 17 mg/dL (ref 6–23)
CO2: 29 mEq/L (ref 19–32)
Calcium: 9.5 mg/dL (ref 8.4–10.5)
Chloride: 103 mEq/L (ref 96–112)
Glucose, Bld: 95 mg/dL (ref 70–99)
Potassium: 4.4 mEq/L (ref 3.5–5.3)
Sodium: 138 mEq/L (ref 135–145)
Total Protein: 7.8 g/dL (ref 6.0–8.3)

## 2012-10-09 ENCOUNTER — Encounter: Payer: Self-pay | Admitting: *Deleted

## 2012-10-09 ENCOUNTER — Telehealth: Payer: Self-pay | Admitting: *Deleted

## 2012-10-09 NOTE — Telephone Encounter (Signed)
Return appt

## 2012-10-09 NOTE — Telephone Encounter (Signed)
Labs mailed to pt home address.

## 2012-10-15 ENCOUNTER — Encounter: Payer: Self-pay | Admitting: Internal Medicine

## 2012-10-15 ENCOUNTER — Ambulatory Visit (INDEPENDENT_AMBULATORY_CARE_PROVIDER_SITE_OTHER): Payer: BC Managed Care – PPO | Admitting: Internal Medicine

## 2012-10-15 ENCOUNTER — Inpatient Hospital Stay (HOSPITAL_BASED_OUTPATIENT_CLINIC_OR_DEPARTMENT_OTHER): Admission: RE | Admit: 2012-10-15 | Payer: BC Managed Care – PPO | Source: Ambulatory Visit

## 2012-10-15 VITALS — BP 128/74 | HR 86 | Temp 97.0°F | Resp 18 | Wt 179.0 lb

## 2012-10-15 DIAGNOSIS — I10 Essential (primary) hypertension: Secondary | ICD-10-CM

## 2012-10-15 DIAGNOSIS — Z0000 Encounter for general adult medical examination without abnormal findings: Secondary | ICD-10-CM

## 2012-10-15 DIAGNOSIS — E785 Hyperlipidemia, unspecified: Secondary | ICD-10-CM

## 2012-10-15 DIAGNOSIS — K5730 Diverticulosis of large intestine without perforation or abscess without bleeding: Secondary | ICD-10-CM

## 2012-10-15 DIAGNOSIS — M949 Disorder of cartilage, unspecified: Secondary | ICD-10-CM

## 2012-10-15 DIAGNOSIS — J309 Allergic rhinitis, unspecified: Secondary | ICD-10-CM

## 2012-10-15 DIAGNOSIS — M8580 Other specified disorders of bone density and structure, unspecified site: Secondary | ICD-10-CM

## 2012-10-15 DIAGNOSIS — K5790 Diverticulosis of intestine, part unspecified, without perforation or abscess without bleeding: Secondary | ICD-10-CM

## 2012-10-15 LAB — POCT URINALYSIS DIPSTICK
Glucose, UA: NEGATIVE
Nitrite, UA: NEGATIVE
Protein, UA: NEGATIVE
Spec Grav, UA: 1.01
Urobilinogen, UA: NEGATIVE

## 2012-10-15 LAB — HEMOCCULT GUIAC POC 1CARD (OFFICE): Fecal Occult Blood, POC: NEGATIVE

## 2012-10-15 MED ORDER — BENZONATATE 100 MG PO CAPS: 100.0000 mg | ORAL_CAPSULE | Freq: Two times a day (BID) | ORAL | Status: DC | PRN

## 2012-10-15 NOTE — Patient Instructions (Addendum)
See me as needed 

## 2012-10-15 NOTE — Addendum Note (Signed)
Addended by: Raechel Chute D on: 10/15/2012 01:13 PM   Modules accepted: Orders

## 2012-10-15 NOTE — Addendum Note (Signed)
Addended by: Mathews Robinsons on: 10/15/2012 11:45 AM   Modules accepted: Orders

## 2012-10-15 NOTE — Progress Notes (Signed)
Subjective:    Patient ID: Robin Massey, female    DOB: 10-Sep-1946, 66 y.o.   MRN: [id number]  HPI Robin Massey is here for CPE.  Doing well    HTN:  Tolerating BP meds well  Hemorrhoids  No bleeding now  Osteopenia  She is due for Dexa  She has mammogram schedule for tomorrow  Allergies  Allergen Reactions  . Penicillins Other (See Comments)    Reaction as a child   Past Medical History  Diagnosis Date  . MRSA (methicillin resistant Staphylococcus aureus)   . Osteopenia   . Hemorrhoids   . Diverticulosis    Past Surgical History  Procedure Date  . Hemorrhoid surgery    History   Social History  . Marital Status: Married    Spouse Name: N/A    Number of Children: 2  . Years of Education: N/A   Occupational History  . retired    Social History Main Topics  . Smoking status: Never Smoker   . Smokeless tobacco: Never Used  . Alcohol Use: No     Comment: occasionally  . Drug Use: No  . Sexually Active: Yes -- Female partner(s)   Other Topics Concern  . Not on file   Social History Narrative  . No narrative on file   Family History  Problem Relation Age of Onset  . Osteoporosis Mother   . Dementia Mother   . Heart disease Father     pacemaker heart valve replacement   Patient Active Problem List  Diagnosis  . Hemorrhoids  . Other and unspecified hyperlipidemia  . Overweight  . Hx MRSA infection  . Osteopenia  . Tinea  . Allergic rhinitis  . Diverticulosis  . Essential hypertension, benign   Current Outpatient Prescriptions on File Prior to Visit  Medication Sig Dispense Refill  . amLODipine (NORVASC) 5 MG tablet Take 1 tablet (5 mg total) by mouth daily.  120 tablet  1  . aspirin EC 81 MG tablet Take 81 mg by mouth daily.        Marland Kitchen b complex vitamins capsule Take 1 capsule by mouth daily.        . Calcium Carbonate-Vitamin D (CALTRATE 600+D) 600-400 MG-UNIT per tablet Take 1 tablet by mouth daily.        Marland Kitchen co-enzyme Q-10 30 MG capsule Take 30  mg by mouth daily.        . hydrocortisone-pramoxine (ANALPRAM-HC) 2.5-1 % rectal cream       . Multiple Vitamin (MULTIVITAMIN) tablet Take 1 tablet by mouth daily.        Marland Kitchen VITAMIN D, CHOLECALCIFEROL, PO Take 1 tablet by mouth daily.        . vitamin E 400 UNIT capsule Take 800 Units by mouth daily.       . Cyanocobalamin (VITAMIN B-12 CR PO) Take 1 tablet by mouth daily.        . fluticasone (FLONASE) 50 MCG/ACT nasal spray Place 1 spray into the nose daily.  1 g  1  . GRAPE SEED CR PO Take 1 tablet by mouth daily.             Review of Systems  All other systems reviewed and are negative.       Objective:   Physical Exam Physical Exam  Nursing note and vitals reviewed.  Constitutional: She is oriented to person, place, and time. She appears well-developed and well-nourished.  HENT:  Head: Normocephalic and atraumatic.  Right Ear:  Tympanic membrane and ear canal normal. No drainage. Tympanic membrane is not injected and not erythematous.  Left Ear: Tympanic membrane and ear canal normal. No drainage. Tympanic membrane is not injected and not erythematous.  Nose: Nose normal. Right sinus exhibits no maxillary sinus tenderness and no frontal sinus tenderness. Left sinus exhibits no maxillary sinus tenderness and no frontal sinus tenderness.  Mouth/Throat: Oropharynx is clear and moist. No oral lesions. No oropharyngeal exudate.  Eyes: Conjunctivae and EOM are normal. Pupils are equal, round, and reactive to light.  Neck: Normal range of motion. Neck supple. No JVD present. Carotid bruit is not present. No mass and no thyromegaly present.  Cardiovascular: Normal rate, regular rhythm, S1 normal, S2 normal and intact distal pulses. Exam reveals no gallop and no friction rub.  No murmur heard.  Pulses:  Carotid pulses are 2+ on the right side, and 2+ on the left side.  Dorsalis pedis pulses are 2+ on the right side, and 2+ on the left side.  No carotid bruit. No LE edema    Pulmonary/Chest: Breath sounds normal. She has no wheezes. She has no rales. She exhibits no tenderness.  Breasts no discrete masses no nipple discharge no axillary adenopathy bilaterally Abdominal: Soft. Bowel sounds are normal. She exhibits no distension and no mass. There is no hepatosplenomegaly. There is no tenderness. There is no CVA tenderness. Rectal  External hemmorroidal tags, no masses guaiac neg Musculoskeletal: Normal range of motion.  No active synovitis to joints.  Lymphadenopathy:  She has no cervical adenopathy.  She has no axillary adenopathy.  Right: No inguinal and no supraclavicular adenopathy present.  Left: No inguinal and no supraclavicular adenopathy present.  Neurological: She is alert and oriented to person, place, and time. She has normal strength and normal reflexes. She displays no tremor. No cranial nerve deficit or sensory deficit. Coordination and gait normal.  Skin: Skin is warm and dry. No rash noted. No cyanosis. Nails show no clubbing.  Psychiatric: She has a normal mood and affect. Her speech is normal and behavior is normal. Cognition and memory are normal.           Assessment & Plan:  Health maintenance:  Dexa will be ordered when pt returns from [place].  MM scheduled for am/  UTD with vaccines   HTN: well controlled  Hyperlipidemai mild  Follow DASH diet  Allergic rhinitis  Continue current meds  Diverticulosis  Hemorrhoids  Osteopenia  See above  See me as needed

## 2012-10-16 ENCOUNTER — Ambulatory Visit (HOSPITAL_BASED_OUTPATIENT_CLINIC_OR_DEPARTMENT_OTHER)
Admission: RE | Admit: 2012-10-16 | Discharge: 2012-10-16 | Disposition: A | Discharge status: Home / Self Care | Payer: BC Managed Care – PPO | Source: Ambulatory Visit | Attending: Internal Medicine | Admitting: Internal Medicine

## 2012-10-16 DIAGNOSIS — Z1231 Encounter for screening mammogram for malignant neoplasm of breast: Secondary | ICD-10-CM

## 2012-10-18 ENCOUNTER — Encounter: Payer: BC Managed Care – PPO | Admitting: Internal Medicine

## 2013-06-19 ENCOUNTER — Telehealth: Payer: Self-pay | Admitting: *Deleted

## 2013-06-19 DIAGNOSIS — I10 Essential (primary) hypertension: Secondary | ICD-10-CM

## 2013-06-19 MED ORDER — AMLODIPINE BESYLATE 5 MG PO TABS: 5.0000 mg | ORAL_TABLET | Freq: Every day | ORAL | Status: DC

## 2013-06-19 NOTE — Telephone Encounter (Signed)
Rcvd fx from pharm requesting updated script for Amlodipine 5mg  - Sent e-script #90 with 3 refills per Dr Constance Goltz

## 2013-08-07 ENCOUNTER — Encounter: Payer: Self-pay | Admitting: Internal Medicine

## 2013-09-06 ENCOUNTER — Ambulatory Visit (INDEPENDENT_AMBULATORY_CARE_PROVIDER_SITE_OTHER): Payer: Medicare Other | Admitting: *Deleted

## 2013-09-06 DIAGNOSIS — Z23 Encounter for immunization: Secondary | ICD-10-CM

## 2013-09-19 ENCOUNTER — Other Ambulatory Visit: Payer: Self-pay | Admitting: Internal Medicine

## 2013-09-19 DIAGNOSIS — Z1231 Encounter for screening mammogram for malignant neoplasm of breast: Secondary | ICD-10-CM

## 2013-10-18 ENCOUNTER — Ambulatory Visit (HOSPITAL_BASED_OUTPATIENT_CLINIC_OR_DEPARTMENT_OTHER)
Admission: RE | Admit: 2013-10-18 | Discharge: 2013-10-18 | Disposition: A | Discharge status: Home / Self Care | Payer: Medicare Other | Source: Ambulatory Visit | Attending: Internal Medicine | Admitting: Internal Medicine

## 2013-10-18 ENCOUNTER — Telehealth: Payer: Self-pay | Admitting: *Deleted

## 2013-10-18 DIAGNOSIS — Z1231 Encounter for screening mammogram for malignant neoplasm of breast: Secondary | ICD-10-CM | POA: Insufficient documentation

## 2013-11-11 NOTE — Telephone Encounter (Signed)
error 

## 2013-11-25 ENCOUNTER — Ambulatory Visit (INDEPENDENT_AMBULATORY_CARE_PROVIDER_SITE_OTHER): Payer: Medicare Other | Admitting: Internal Medicine

## 2013-11-25 ENCOUNTER — Encounter: Payer: Self-pay | Admitting: Internal Medicine

## 2013-11-25 VITALS — BP 124/73 | HR 80 | Resp 16 | Ht 64.0 in | Wt 170.0 lb

## 2013-11-25 DIAGNOSIS — I10 Essential (primary) hypertension: Secondary | ICD-10-CM

## 2013-11-25 DIAGNOSIS — Z808 Family history of malignant neoplasm of other organs or systems: Secondary | ICD-10-CM | POA: Insufficient documentation

## 2013-11-25 DIAGNOSIS — E663 Overweight: Secondary | ICD-10-CM

## 2013-11-25 DIAGNOSIS — J309 Allergic rhinitis, unspecified: Secondary | ICD-10-CM

## 2013-11-25 DIAGNOSIS — M899 Disorder of bone, unspecified: Secondary | ICD-10-CM

## 2013-11-25 DIAGNOSIS — Z0000 Encounter for general adult medical examination without abnormal findings: Secondary | ICD-10-CM

## 2013-11-25 DIAGNOSIS — M8580 Other specified disorders of bone density and structure, unspecified site: Secondary | ICD-10-CM

## 2013-11-25 DIAGNOSIS — M949 Disorder of cartilage, unspecified: Secondary | ICD-10-CM

## 2013-11-25 DIAGNOSIS — E785 Hyperlipidemia, unspecified: Secondary | ICD-10-CM

## 2013-11-25 LAB — CBC WITH DIFFERENTIAL/PLATELET
Basophils Absolute: 0 10*3/uL (ref 0.0–0.1)
Basophils Relative: 0 % (ref 0–1)
EOS ABS: 0.2 10*3/uL (ref 0.0–0.7)
EOS PCT: 4 % (ref 0–5)
HCT: 39.1 % (ref 36.0–46.0)
HEMOGLOBIN: 13.3 g/dL (ref 12.0–15.0)
LYMPHS ABS: 1.5 10*3/uL (ref 0.7–4.0)
Lymphocytes Relative: 28 % (ref 12–46)
MCH: 31.2 pg (ref 26.0–34.0)
MCHC: 34 g/dL (ref 30.0–36.0)
MCV: 91.8 fL (ref 78.0–100.0)
MONOS PCT: 7 % (ref 3–12)
Monocytes Absolute: 0.4 10*3/uL (ref 0.1–1.0)
Neutro Abs: 3.1 10*3/uL (ref 1.7–7.7)
Neutrophils Relative %: 61 % (ref 43–77)
Platelets: 296 10*3/uL (ref 150–400)
RBC: 4.26 MIL/uL (ref 3.87–5.11)
RDW: 13.5 % (ref 11.5–15.5)
WBC: 5.1 10*3/uL (ref 4.0–10.5)

## 2013-11-25 LAB — COMPREHENSIVE METABOLIC PANEL
ALK PHOS: 60 U/L (ref 39–117)
ALT: 12 U/L (ref 0–35)
AST: 16 U/L (ref 0–37)
Albumin: 4.1 g/dL (ref 3.5–5.2)
BILIRUBIN TOTAL: 0.4 mg/dL (ref 0.3–1.2)
BUN: 18 mg/dL (ref 6–23)
CO2: 30 meq/L (ref 19–32)
CREATININE: 0.73 mg/dL (ref 0.50–1.10)
Calcium: 9.2 mg/dL (ref 8.4–10.5)
Chloride: 100 mEq/L (ref 96–112)
GLUCOSE: 89 mg/dL (ref 70–99)
Potassium: 4.6 mEq/L (ref 3.5–5.3)
SODIUM: 137 meq/L (ref 135–145)
TOTAL PROTEIN: 8.6 g/dL — AB (ref 6.0–8.3)

## 2013-11-25 LAB — TSH: TSH: 1.229 u[IU]/mL (ref 0.350–4.500)

## 2013-11-25 LAB — LIPID PANEL
CHOL/HDL RATIO: 3.4 ratio
Cholesterol: 208 mg/dL — ABNORMAL HIGH (ref 0–200)
HDL: 62 mg/dL (ref >39)
LDL CALC: 123 mg/dL — AB (ref 0–99)
Triglycerides: 115 mg/dL (ref <150)
VLDL: 23 mg/dL (ref 0–40)

## 2013-11-25 NOTE — Progress Notes (Signed)
Subjective:    Patient ID: Robin Massey, female    DOB: [birth year], 68 y.o.   MRN: [id number]  HPI  Robin Massey is here for CPE  HTN  Tolerating meds well  Hyperlipidemia  Has been trying to follow DASH diet  Allergies  Allergen Reactions  . Penicillins Other (See Comments)    Reaction as a child   Past Medical History  Diagnosis Date  . MRSA (methicillin resistant Staphylococcus aureus)   . Osteopenia   . Hemorrhoids   . Diverticulosis    Past Surgical History  Procedure Laterality Date  . Hemorrhoid surgery     History   Social History  . Marital Status: Married    Spouse Name: N/A    Number of Children: 2  . Years of Education: N/A   Occupational History  . retired    Social History Main Topics  . Smoking status: Never Smoker   . Smokeless tobacco: Never Used  . Alcohol Use: No     Comment: occasionally  . Drug Use: No  . Sexual Activity: Yes    Partners: Male   Other Topics Concern  . Not on file   Social History Narrative  . No narrative on file   Family History  Problem Relation Age of Onset  . Osteoporosis Mother   . Dementia Mother   . Heart disease Father     pacemaker heart valve replacement   Patient Active Problem List   Diagnosis Date Noted  . Essential hypertension, benign 03/08/2012  . Tinea 10/04/2011  . Allergic rhinitis 10/04/2011  . Diverticulosis 10/04/2011  . Osteopenia 06/13/2011  . Other and unspecified hyperlipidemia 06/12/2011  . Overweight 06/12/2011  . Hx MRSA infection 06/12/2011  . Hemorrhoids 05/23/2011   Current Outpatient Prescriptions on File Prior to Visit  Medication Sig Dispense Refill  . amLODipine (NORVASC) 5 MG tablet Take 1 tablet (5 mg total) by mouth daily.  90 tablet  3  . aspirin EC 81 MG tablet Take 81 mg by mouth daily.        Marland Kitchen b complex vitamins capsule Take 1 capsule by mouth daily.        . Calcium Carbonate-Vitamin D (CALTRATE 600+D) 600-400 MG-UNIT per tablet Take 1 tablet by mouth  daily.        Marland Kitchen co-enzyme Q-10 30 MG capsule Take 30 mg by mouth daily.        . Cyanocobalamin (VITAMIN B-12 CR PO) Take 1 tablet by mouth daily.        Marland Kitchen GRAPE SEED CR PO Take 1 tablet by mouth daily.        . Multiple Vitamin (MULTIVITAMIN) tablet Take 1 tablet by mouth daily.        Marland Kitchen VITAMIN D, CHOLECALCIFEROL, PO Take 1 tablet by mouth daily.        . vitamin E 400 UNIT capsule Take 800 Units by mouth daily.       . fluticasone (FLONASE) 50 MCG/ACT nasal spray Place 1 spray into the nose daily.  1 g  1   No current facility-administered medications on file prior to visit.      Review of Systems     Objective:   Physical Exam Physical Exam  Nursing note and vitals reviewed.  Constitutional: She is oriented to person, place, and time. She appears well-developed and well-nourished.  HENT:  Head: Normocephalic and atraumatic.  Right Ear: Tympanic membrane and ear canal normal. No drainage. Tympanic membrane is not  injected and not erythematous.  Left Ear: Tympanic membrane and ear canal normal. No drainage. Tympanic membrane is not injected and not erythematous.  Nose: Nose normal. Right sinus exhibits no maxillary sinus tenderness and no frontal sinus tenderness. Left sinus exhibits no maxillary sinus tenderness and no frontal sinus tenderness.  Mouth/Throat: Oropharynx is clear and moist. No oral lesions. No oropharyngeal exudate.  Eyes: Conjunctivae and EOM are normal. Pupils are equal, round, and reactive to light.  Neck: Normal range of motion. Neck supple. No JVD present. Carotid bruit is not present. No mass and no thyromegaly present.  Cardiovascular: Normal rate, regular rhythm, S1 normal, S2 normal and intact distal pulses. Exam reveals no gallop and no friction rub.  No murmur heard.  Pulses:  Carotid pulses are 2+ on the right side, and 2+ on the left side.  Dorsalis pedis pulses are 2+ on the right side, and 2+ on the left side.  No carotid bruit. No LE edema    Pulmonary/Chest: Breath sounds normal. She has no wheezes. She has no rales. She exhibits no tenderness.   Breast no discrete mass no nipple discharge no axillary adenopathy bilaterally Abdominal: Soft. Bowel sounds are normal. She exhibits no distension and no mass. There is no hepatosplenomegaly. There is no tenderness. There is no CVA tenderness.   Rectal guaiac negative Musculoskeletal: Normal range of motion.  No active synovitis to joints.  Lymphadenopathy:  She has no cervical adenopathy.  She has no axillary adenopathy.  Right: No inguinal and no supraclavicular adenopathy present.  Left: No inguinal and no supraclavicular adenopathy present.  Neurological: She is alert and oriented to person, place, and time. She has normal strength and normal reflexes. She displays no tremor. No cranial nerve deficit or sensory deficit. Coordination and gait normal.  Skin: Skin is warm and dry. No rash noted. No cyanosis. Nails show no clubbing.  Psychiatric: She has a normal mood and affect. Her speech is normal and behavior is normal. Cognition and memory are normal.       Assessment & Plan:  Health Maintenance  Will set up DEXA  Guaiac neg.  UTD with vaccines  See scanned sheet  HTN  Continue meds  Hyperlipidemia  Will check today  Osteopenia  Will get DEXA  Further management based on results  Family history of melanoma in father.  She reports that Dr. Valli Glance no longer takes medicare.  Will refer to Dr. Sammuel Hines for skin surveillance exam

## 2013-11-25 NOTE — Patient Instructions (Signed)
Will set up referral to Dr. Sammuel Hines  Dermatologist in [facility]  Will set up DEXA osteoprosis test at [facility]    See me in 6 months or prn    Go to lab today

## 2013-11-26 ENCOUNTER — Encounter: Payer: Self-pay | Admitting: *Deleted

## 2013-11-26 LAB — POCT URINALYSIS DIPSTICK
Bilirubin, UA: NEGATIVE
Glucose, UA: NEGATIVE
Ketones, UA: NEGATIVE
Leukocytes, UA: NEGATIVE
NITRITE UA: NEGATIVE
PH UA: 6
PROTEIN UA: NEGATIVE
RBC UA: NEGATIVE
Spec Grav, UA: 1.01
UROBILINOGEN UA: 0.2

## 2013-11-26 LAB — VITAMIN D 25 HYDROXY (VIT D DEFICIENCY, FRACTURES): VIT D 25 HYDROXY: 66 ng/mL (ref 30–89)

## 2013-12-02 ENCOUNTER — Ambulatory Visit (HOSPITAL_COMMUNITY)
Admission: RE | Admit: 2013-12-02 | Discharge: 2013-12-02 | Disposition: A | Discharge status: Home / Self Care | Payer: Medicare Other | Source: Ambulatory Visit | Attending: Internal Medicine | Admitting: Internal Medicine

## 2013-12-02 DIAGNOSIS — Z780 Asymptomatic menopausal state: Secondary | ICD-10-CM | POA: Insufficient documentation

## 2013-12-02 DIAGNOSIS — M8580 Other specified disorders of bone density and structure, unspecified site: Secondary | ICD-10-CM

## 2013-12-02 DIAGNOSIS — Z13820 Encounter for screening for osteoporosis: Secondary | ICD-10-CM | POA: Insufficient documentation

## 2013-12-03 ENCOUNTER — Encounter: Payer: Self-pay | Admitting: Internal Medicine

## 2013-12-03 ENCOUNTER — Telehealth: Payer: Self-pay | Admitting: Internal Medicine

## 2013-12-03 NOTE — Telephone Encounter (Signed)
Notified pt of bone density results

## 2013-12-03 NOTE — Telephone Encounter (Signed)
Robin Massey  Call pt and let her know that I have seen her bone density report and she has ostoepenia , no osteoporosis.  Take calcium 1000-1200 units daily with vitamin D [phone] units daily

## 2013-12-15 ENCOUNTER — Encounter: Payer: Self-pay | Admitting: Internal Medicine

## 2013-12-15 DIAGNOSIS — C4491 Basal cell carcinoma of skin, unspecified: Secondary | ICD-10-CM | POA: Insufficient documentation

## 2013-12-19 ENCOUNTER — Encounter: Payer: Self-pay | Admitting: *Deleted

## 2014-04-03 LAB — HEMOCCULT GUIAC POC 1CARD (OFFICE): Fecal Occult Blood, POC: NEGATIVE

## 2014-04-03 NOTE — Addendum Note (Signed)
Addended by: Conley Rolls on: 04/03/2014 10:44 AM   Modules accepted: Orders

## 2014-04-22 ENCOUNTER — Encounter: Payer: Self-pay | Admitting: Internal Medicine

## 2014-04-22 ENCOUNTER — Ambulatory Visit (INDEPENDENT_AMBULATORY_CARE_PROVIDER_SITE_OTHER): Payer: Medicare Other | Admitting: Internal Medicine

## 2014-04-22 VITALS — BP 124/79 | HR 88 | Temp 97.8°F | Resp 18 | Ht 64.0 in | Wt 172.0 lb

## 2014-04-22 DIAGNOSIS — E785 Hyperlipidemia, unspecified: Secondary | ICD-10-CM

## 2014-04-22 DIAGNOSIS — I10 Essential (primary) hypertension: Secondary | ICD-10-CM

## 2014-04-22 LAB — LIPID PANEL
Cholesterol: 159 mg/dL (ref 0–200)
HDL: 62 mg/dL (ref >39)
LDL Cholesterol: 71 mg/dL (ref 0–99)
TRIGLYCERIDES: 131 mg/dL (ref <150)
Total CHOL/HDL Ratio: 2.6 Ratio
VLDL: 26 mg/dL (ref 0–40)

## 2014-04-22 NOTE — Patient Instructions (Signed)
See me as needed 

## 2014-04-22 NOTE — Progress Notes (Signed)
Subjective:    Patient ID: Robin Massey, female    DOB: 01-Nov-1946, 68 y.o.   MRN: [id number]  HPI  Robin Massey is here for follow up of lipids and HTN.  She has been trying to follow DASH diet    Allergies  Allergen Reactions  . Penicillins Other (See Comments)    Reaction as a child   Past Medical History  Diagnosis Date  . MRSA (methicillin resistant Staphylococcus aureus)   . Osteopenia   . Hemorrhoids   . Diverticulosis    Past Surgical History  Procedure Laterality Date  . Hemorrhoid surgery     History   Social History  . Marital Status: Married    Spouse Name: N/A    Number of Children: 2  . Years of Education: N/A   Occupational History  . retired    Social History Main Topics  . Smoking status: Never Smoker   . Smokeless tobacco: Never Used  . Alcohol Use: No     Comment: occasionally  . Drug Use: No  . Sexual Activity: Yes    Partners: Male   Other Topics Concern  . Not on file   Social History Narrative  . No narrative on file   Family History  Problem Relation Age of Onset  . Osteoporosis Mother   . Dementia Mother   . Heart disease Father     pacemaker heart valve replacement   Patient Active Problem List   Diagnosis Date Noted  . Basal cell carcinoma 12/15/2013  . Family history of melanoma 11/25/2013  . Essential hypertension, benign 03/08/2012  . Tinea 10/04/2011  . Allergic rhinitis 10/04/2011  . Diverticulosis 10/04/2011  . Osteopenia 06/13/2011  . Other and unspecified hyperlipidemia 06/12/2011  . Overweight 06/12/2011  . Hx MRSA infection 06/12/2011  . Hemorrhoids 05/23/2011   Current Outpatient Prescriptions on File Prior to Visit  Medication Sig Dispense Refill  . amLODipine (NORVASC) 5 MG tablet Take 1 tablet (5 mg total) by mouth daily.  90 tablet  3  . aspirin EC 81 MG tablet Take 81 mg by mouth daily.        Marland Kitchen b complex vitamins capsule Take 1 capsule by mouth daily.        . Calcium Carbonate-Vitamin D  (CALTRATE 600+D) 600-400 MG-UNIT per tablet Take 1 tablet by mouth daily.        Marland Kitchen co-enzyme Q-10 30 MG capsule Take 30 mg by mouth daily.        . Cyanocobalamin (VITAMIN B-12 CR PO) Take 1 tablet by mouth daily.        Marland Kitchen GRAPE SEED CR PO Take 1 tablet by mouth daily.        . Multiple Vitamin (MULTIVITAMIN) tablet Take 1 tablet by mouth daily.        Marland Kitchen senna-docusate (STOOL SOFTENER & LAXATIVE) 8.6-50 MG per tablet Take 1 tablet by mouth daily.      Marland Kitchen VITAMIN D, CHOLECALCIFEROL, PO Take 1 tablet by mouth daily.        . vitamin E 400 UNIT capsule Take 800 Units by mouth daily.       . fluticasone (FLONASE) 50 MCG/ACT nasal spray Place 1 spray into the nose daily.  1 g  1   No current facility-administered medications on file prior to visit.      Review of Systems See HPI     Objective:   Physical Exam Physical Exam  Nursing note and vitals reviewed.  Constitutional: She is oriented to person, place, and time. She appears well-developed and well-nourished.  HENT:  Head: Normocephalic and atraumatic.  Cardiovascular: Normal rate and regular rhythm. Exam reveals no gallop and no friction rub.  No murmur heard.  Pulmonary/Chest: Breath sounds normal. She has no wheezes. She has no rales.  Neurological: She is alert and oriented to person, place, and time.  Skin: Skin is warm and dry.  Psychiatric: She has a normal mood and affect. Her behavior is normal.          Assessment & Plan:  Hyperlipidemia  Will check today    HTN:  Continue meds

## 2014-04-24 ENCOUNTER — Encounter: Payer: Self-pay | Admitting: *Deleted

## 2014-05-26 ENCOUNTER — Ambulatory Visit: Payer: Medicare Other | Admitting: Internal Medicine

## 2014-09-15 ENCOUNTER — Other Ambulatory Visit: Payer: Self-pay | Admitting: Internal Medicine

## 2014-09-15 DIAGNOSIS — Z1231 Encounter for screening mammogram for malignant neoplasm of breast: Secondary | ICD-10-CM

## 2014-10-06 ENCOUNTER — Other Ambulatory Visit: Payer: Self-pay | Admitting: *Deleted

## 2014-10-06 DIAGNOSIS — I10 Essential (primary) hypertension: Secondary | ICD-10-CM

## 2014-10-06 MED ORDER — AMLODIPINE BESYLATE 5 MG PO TABS: 5.0000 mg | ORAL_TABLET | Freq: Every day | ORAL | Status: DC

## 2014-10-06 NOTE — Telephone Encounter (Signed)
Refill request- patient requesting 120 day supply

## 2014-10-17 ENCOUNTER — Other Ambulatory Visit: Payer: Self-pay | Admitting: Internal Medicine

## 2014-10-17 DIAGNOSIS — Z1231 Encounter for screening mammogram for malignant neoplasm of breast: Secondary | ICD-10-CM

## 2014-10-20 ENCOUNTER — Ambulatory Visit (HOSPITAL_BASED_OUTPATIENT_CLINIC_OR_DEPARTMENT_OTHER)
Admission: RE | Admit: 2014-10-20 | Discharge: 2014-10-20 | Disposition: A | Discharge status: Home / Self Care | Payer: Medicare Other | Source: Ambulatory Visit | Attending: Internal Medicine | Admitting: Internal Medicine

## 2014-10-20 DIAGNOSIS — Z1231 Encounter for screening mammogram for malignant neoplasm of breast: Secondary | ICD-10-CM

## 2014-11-25 ENCOUNTER — Other Ambulatory Visit: Payer: Self-pay | Admitting: *Deleted

## 2014-11-25 DIAGNOSIS — E785 Hyperlipidemia, unspecified: Secondary | ICD-10-CM

## 2014-11-25 DIAGNOSIS — Z0189 Encounter for other specified special examinations: Secondary | ICD-10-CM | POA: Diagnosis not present

## 2014-11-25 DIAGNOSIS — I10 Essential (primary) hypertension: Secondary | ICD-10-CM | POA: Diagnosis not present

## 2014-11-25 DIAGNOSIS — Z0000 Encounter for general adult medical examination without abnormal findings: Secondary | ICD-10-CM

## 2014-11-26 ENCOUNTER — Ambulatory Visit (INDEPENDENT_AMBULATORY_CARE_PROVIDER_SITE_OTHER): Payer: Medicare Other | Admitting: Internal Medicine

## 2014-11-26 ENCOUNTER — Other Ambulatory Visit (HOSPITAL_COMMUNITY)
Admission: RE | Admit: 2014-11-26 | Discharge: 2014-11-26 | Disposition: A | Discharge status: Home / Self Care | Payer: Medicare Other | Source: Ambulatory Visit | Attending: Internal Medicine | Admitting: Internal Medicine

## 2014-11-26 ENCOUNTER — Encounter: Payer: Self-pay | Admitting: Internal Medicine

## 2014-11-26 VITALS — BP 138/71 | HR 79 | Resp 16 | Ht 63.0 in | Wt 176.0 lb

## 2014-11-26 DIAGNOSIS — E785 Hyperlipidemia, unspecified: Secondary | ICD-10-CM

## 2014-11-26 DIAGNOSIS — I10 Essential (primary) hypertension: Secondary | ICD-10-CM | POA: Diagnosis not present

## 2014-11-26 DIAGNOSIS — Z124 Encounter for screening for malignant neoplasm of cervix: Secondary | ICD-10-CM | POA: Diagnosis not present

## 2014-11-26 DIAGNOSIS — Z1151 Encounter for screening for human papillomavirus (HPV): Secondary | ICD-10-CM | POA: Diagnosis not present

## 2014-11-26 DIAGNOSIS — M8580 Other specified disorders of bone density and structure, unspecified site: Secondary | ICD-10-CM

## 2014-11-26 DIAGNOSIS — Z1211 Encounter for screening for malignant neoplasm of colon: Secondary | ICD-10-CM

## 2014-11-26 DIAGNOSIS — Z0000 Encounter for general adult medical examination without abnormal findings: Secondary | ICD-10-CM | POA: Diagnosis not present

## 2014-11-26 DIAGNOSIS — R319 Hematuria, unspecified: Secondary | ICD-10-CM

## 2014-11-26 LAB — HEMOCCULT GUIAC POC 1CARD (OFFICE)
Card #1 Date: 1132016
Fecal Occult Blood, POC: NEGATIVE

## 2014-11-26 LAB — COMPLETE METABOLIC PANEL WITH GFR
ALK PHOS: 55 U/L (ref 39–117)
ALT: 14 U/L (ref 0–35)
AST: 18 U/L (ref 0–37)
Albumin: 3.7 g/dL (ref 3.5–5.2)
BILIRUBIN TOTAL: 0.4 mg/dL (ref 0.2–1.2)
BUN: 18 mg/dL (ref 6–23)
CO2: 24 meq/L (ref 19–32)
CREATININE: 0.65 mg/dL (ref 0.50–1.10)
Calcium: 9 mg/dL (ref 8.4–10.5)
Chloride: 101 mEq/L (ref 96–112)
GLUCOSE: 89 mg/dL (ref 70–99)
Potassium: 4.7 mEq/L (ref 3.5–5.3)
SODIUM: 135 meq/L (ref 135–145)
Total Protein: 8.3 g/dL (ref 6.0–8.3)

## 2014-11-26 LAB — LIPID PANEL
Cholesterol: 186 mg/dL (ref 0–200)
HDL: 68 mg/dL (ref >39)
LDL CALC: 101 mg/dL — AB (ref 0–99)
Total CHOL/HDL Ratio: 2.7 Ratio
Triglycerides: 85 mg/dL (ref <150)
VLDL: 17 mg/dL (ref 0–40)

## 2014-11-26 LAB — POCT URINALYSIS DIPSTICK
BILIRUBIN UA: NEGATIVE
Glucose, UA: NEGATIVE
KETONES UA: NEGATIVE
Nitrite, UA: NEGATIVE
Spec Grav, UA: 1.02
UROBILINOGEN UA: NEGATIVE
pH, UA: 6

## 2014-11-26 LAB — TSH: TSH: 2.219 u[IU]/mL (ref 0.350–4.500)

## 2014-11-26 MED ORDER — AMLODIPINE BESYLATE 5 MG PO TABS: 5.0000 mg | ORAL_TABLET | Freq: Every day | ORAL | Status: AC

## 2014-11-26 NOTE — Addendum Note (Signed)
Addended by: Emi Belfast D on: 11/26/2014 09:58 AM   Modules accepted: Orders

## 2014-11-26 NOTE — Progress Notes (Signed)
Subjective:    Patient ID: Robin Massey, female    DOB: 08/31/1946, 69 y.o.   MRN: [id number]  HPI 11/2013  CPE Health Maintenance Will set up DEXA Guaiac neg. UTD with vaccines See scanned sheet  HTN Continue meds  Hyperlipidemia Will check today  Osteopenia Will get DEXA Further management based on results  Family history of melanoma in father. She reports that Dr. Valli Glance no longer takes medicare. Will refer to Dr. Sammuel Hines for skin surveillance exam  TODAY:  Robin Massey is here for CPE  HM:  UTD  With mm, dexa   Needs pap today   Hyperlipidemia followin DIEt  HTN  Tolerating meds   Osteopenia  On calcium vitamin D    Review of Systems  Respiratory: Negative for cough, chest tightness, shortness of breath and wheezing.   Cardiovascular: Negative for chest pain and leg swelling.  All other systems reviewed and are negative.      Objective:   Physical Exam  Physical Exam  Vital signs and nursing note reviewed  Constitutional: She is oriented to person, place, and time. She appears well-developed and well-nourished. She is cooperative.  HENT:  Head: Normocephalic and atraumatic.  Right Ear: Tympanic membrane normal.  Left Ear: Tympanic membrane normal.  Nose: Nose normal.  Mouth/Throat: Oropharynx is clear and moist and mucous membranes are normal. No oropharyngeal exudate or posterior oropharyngeal erythema.  Eyes: Conjunctivae and EOM are normal. Pupils are equal, round, and reactive to light.  Neck: Neck supple. No JVD present. Carotid bruit is not present. No mass and no thyromegaly present.  Cardiovascular: Regular rhythm, normal heart sounds, intact distal pulses and normal pulses.  Exam reveals no gallop and no friction rub.   No murmur heard. Pulses:      Dorsalis pedis pulses are 2+ on the right side, and 2+ on the left side.  Pulmonary/Chest: Breath sounds normal. She has no wheezes. She has no rhonchi. She has no rales. Right  breast exhibits no mass, no nipple discharge and no skin change. Left breast exhibits no mass, no nipple discharge and no skin change.  Abdominal: Soft. Bowel sounds are normal. She exhibits no distension and no mass. There is no hepatosplenomegaly. There is no tenderness. There is no CVA tenderness.  Genitourinary: Rectum normal, vagina normal and uterus normal. Rectal exam shows no mass. Guaiac negative stool. No labial fusion. There is no lesion on the right labia. There is no lesion on the left labia. Cervix exhibits no motion tenderness. Right adnexum displays no mass, no tenderness and no fullness. Left adnexum displays no mass, no tenderness and no fullness. No erythema around the vagina.  Musculoskeletal:       No active synovitis to any joint.    Lymphadenopathy:       Right cervical: No superficial cervical adenopathy present.      Left cervical: No superficial cervical adenopathy present.       Right axillary: No pectoral and no lateral adenopathy present.       Left axillary: No pectoral and no lateral adenopathy present.      Right: No inguinal adenopathy present.       Left: No inguinal adenopathy present.  Neurological: She is alert and oriented to person, place, and time. She has normal strength and normal reflexes. No cranial nerve deficit or sensory deficit. She displays a negative Romberg sign. Coordination and gait normal.  Skin: Skin is warm and dry. No abrasion, no bruising, no ecchymosis and  no rash noted. No cyanosis. Nails show no clubbing.  Psychiatric: She has a normal mood and affect. Her speech is normal and behavior is normal.          Assessment & Plan:   HM  Pap today, mm UTD,   Will order Prevnar 13  Pt is a non-smoker  HTN  continue meds  Hyperlipidemia  Mild will recheck today   Hematuria  Pt asymtpomatic  Will send for cultrue microscopy  Osteopenia  On Dexa 2015  Recheck 2017  Calcium vitamin D  Vaginal dryness:  luvena samples given  Reports no  pain with intercourse  Basal cell skin CA  Advised yearly derm exam   See me as needed      Assessment & Plan:

## 2014-11-26 NOTE — Addendum Note (Signed)
Addended by: Susanne Greenhouse E on: 11/26/2014 10:10 AM   Modules accepted: Orders

## 2014-11-27 ENCOUNTER — Encounter: Payer: Self-pay | Admitting: *Deleted

## 2014-11-27 LAB — CBC WITH DIFFERENTIAL/PLATELET
BASOS ABS: 0 10*3/uL (ref 0.0–0.1)
Basophils Relative: 1 % (ref 0–1)
Eosinophils Absolute: 0.1 10*3/uL (ref 0.0–0.7)
Eosinophils Relative: 3 % (ref 0–5)
HEMATOCRIT: 37 % (ref 36.0–46.0)
HEMOGLOBIN: 12.1 g/dL (ref 12.0–15.0)
LYMPHS ABS: 1.6 10*3/uL (ref 0.7–4.0)
LYMPHS PCT: 34 % (ref 12–46)
MCH: 30.3 pg (ref 26.0–34.0)
MCHC: 32.7 g/dL (ref 30.0–36.0)
MCV: 92.7 fL (ref 78.0–100.0)
MPV: 10.6 fL (ref 8.6–12.4)
Monocytes Absolute: 0.4 10*3/uL (ref 0.1–1.0)
Monocytes Relative: 9 % (ref 3–12)
NEUTROS ABS: 2.5 10*3/uL (ref 1.7–7.7)
NEUTROS PCT: 53 % (ref 43–77)
Platelets: 272 10*3/uL (ref 150–400)
RBC: 3.99 MIL/uL (ref 3.87–5.11)
RDW: 14.5 % (ref 11.5–15.5)
WBC: 4.7 10*3/uL (ref 4.0–10.5)

## 2014-11-27 LAB — URINALYSIS, MICROSCOPIC ONLY
Bacteria, UA: NONE SEEN
CRYSTALS: NONE SEEN
Casts: NONE SEEN
Squamous Epithelial / LPF: NONE SEEN

## 2014-11-27 LAB — URINE CULTURE
COLONY COUNT: NO GROWTH
Organism ID, Bacteria: NO GROWTH

## 2014-11-27 LAB — VITAMIN D 25 HYDROXY (VIT D DEFICIENCY, FRACTURES): Vit D, 25-Hydroxy: 63 ng/mL (ref 30–100)

## 2014-11-27 LAB — CYTOLOGY - PAP

## 2014-12-01 NOTE — Progress Notes (Signed)
A copy of labs were mailed to the patient -eh

## 2014-12-01 NOTE — Progress Notes (Signed)
I spoke with Robin Massey about her urine results -eh

## 2015-02-25 DIAGNOSIS — H2513 Age-related nuclear cataract, bilateral: Secondary | ICD-10-CM | POA: Diagnosis not present

## 2015-02-25 DIAGNOSIS — H25013 Cortical age-related cataract, bilateral: Secondary | ICD-10-CM | POA: Diagnosis not present

## 2015-08-28 ENCOUNTER — Other Ambulatory Visit (HOSPITAL_BASED_OUTPATIENT_CLINIC_OR_DEPARTMENT_OTHER): Payer: Self-pay | Admitting: Family Medicine

## 2015-08-28 DIAGNOSIS — Z1231 Encounter for screening mammogram for malignant neoplasm of breast: Secondary | ICD-10-CM

## 2015-10-21 ENCOUNTER — Encounter: Payer: Self-pay | Admitting: Internal Medicine

## 2015-10-22 ENCOUNTER — Ambulatory Visit (HOSPITAL_BASED_OUTPATIENT_CLINIC_OR_DEPARTMENT_OTHER)
Admission: RE | Admit: 2015-10-22 | Discharge: 2015-10-22 | Disposition: A | Discharge status: Home / Self Care | Payer: Medicare Other | Source: Ambulatory Visit | Attending: Family Medicine | Admitting: Family Medicine

## 2015-10-22 DIAGNOSIS — Z1231 Encounter for screening mammogram for malignant neoplasm of breast: Secondary | ICD-10-CM | POA: Diagnosis present

## 2015-12-10 ENCOUNTER — Other Ambulatory Visit (HOSPITAL_BASED_OUTPATIENT_CLINIC_OR_DEPARTMENT_OTHER): Payer: Self-pay | Admitting: Family Medicine

## 2015-12-10 DIAGNOSIS — M810 Age-related osteoporosis without current pathological fracture: Secondary | ICD-10-CM

## 2015-12-10 DIAGNOSIS — Z299 Encounter for prophylactic measures, unspecified: Secondary | ICD-10-CM

## 2015-12-15 ENCOUNTER — Emergency Department (HOSPITAL_BASED_OUTPATIENT_CLINIC_OR_DEPARTMENT_OTHER)
Admission: EM | Admit: 2015-12-15 | Discharge: 2015-12-15 | Disposition: A | Discharge status: Home / Self Care | Payer: Medicare Other | Attending: Emergency Medicine | Admitting: Emergency Medicine

## 2015-12-15 ENCOUNTER — Emergency Department (HOSPITAL_BASED_OUTPATIENT_CLINIC_OR_DEPARTMENT_OTHER): Payer: Medicare Other

## 2015-12-15 ENCOUNTER — Encounter (HOSPITAL_BASED_OUTPATIENT_CLINIC_OR_DEPARTMENT_OTHER): Payer: Self-pay | Admitting: *Deleted

## 2015-12-15 DIAGNOSIS — Z880 Allergy status to penicillin: Secondary | ICD-10-CM | POA: Insufficient documentation

## 2015-12-15 DIAGNOSIS — Z8719 Personal history of other diseases of the digestive system: Secondary | ICD-10-CM | POA: Insufficient documentation

## 2015-12-15 DIAGNOSIS — Z8614 Personal history of Methicillin resistant Staphylococcus aureus infection: Secondary | ICD-10-CM | POA: Insufficient documentation

## 2015-12-15 DIAGNOSIS — W010XXA Fall on same level from slipping, tripping and stumbling without subsequent striking against object, initial encounter: Secondary | ICD-10-CM | POA: Insufficient documentation

## 2015-12-15 DIAGNOSIS — Y9289 Other specified places as the place of occurrence of the external cause: Secondary | ICD-10-CM | POA: Insufficient documentation

## 2015-12-15 DIAGNOSIS — S022XXA Fracture of nasal bones, initial encounter for closed fracture: Secondary | ICD-10-CM | POA: Insufficient documentation

## 2015-12-15 DIAGNOSIS — Z7982 Long term (current) use of aspirin: Secondary | ICD-10-CM | POA: Diagnosis not present

## 2015-12-15 DIAGNOSIS — Z79899 Other long term (current) drug therapy: Secondary | ICD-10-CM | POA: Diagnosis not present

## 2015-12-15 DIAGNOSIS — Y9389 Activity, other specified: Secondary | ICD-10-CM | POA: Insufficient documentation

## 2015-12-15 DIAGNOSIS — S0121XA Laceration without foreign body of nose, initial encounter: Secondary | ICD-10-CM | POA: Diagnosis not present

## 2015-12-15 DIAGNOSIS — S52122A Displaced fracture of head of left radius, initial encounter for closed fracture: Secondary | ICD-10-CM | POA: Diagnosis not present

## 2015-12-15 DIAGNOSIS — M8580 Other specified disorders of bone density and structure, unspecified site: Secondary | ICD-10-CM | POA: Diagnosis not present

## 2015-12-15 DIAGNOSIS — I10 Essential (primary) hypertension: Secondary | ICD-10-CM | POA: Insufficient documentation

## 2015-12-15 DIAGNOSIS — Y998 Other external cause status: Secondary | ICD-10-CM | POA: Diagnosis not present

## 2015-12-15 DIAGNOSIS — S59902A Unspecified injury of left elbow, initial encounter: Secondary | ICD-10-CM | POA: Diagnosis present

## 2015-12-15 MED ORDER — CEPHALEXIN 500 MG PO CAPS: 500.0000 mg | ORAL_CAPSULE | Freq: Four times a day (QID) | ORAL | Status: AC

## 2015-12-15 MED ORDER — OXYCODONE-ACETAMINOPHEN 5-325 MG PO TABS: 1.0000 | ORAL_TABLET | ORAL | Status: AC | PRN

## 2015-12-15 MED ORDER — IBUPROFEN 800 MG PO TABS: 800.0000 mg | ORAL_TABLET | Freq: Three times a day (TID) | ORAL | Status: AC

## 2015-12-15 NOTE — ED Provider Notes (Signed)
CSN: [id number]     Arrival date & time 12/15/15  1546 History   First MD Initiated Contact with Patient 12/15/15 1558     Chief Complaint  Patient presents with  . Fall     (Consider location/radiation/quality/duration/timing/severity/associated sxs/prior Treatment) HPI  Robin Massey is a 70 y.o. female, with a history of osteopenia and hypertension, presenting to the ED with a trip and fall that occurred just prior to arrival. Patient states that she was taking out the garbage when she tripped over a piece of cardboard and fell straight onto her face and left elbow. Patient complains of pain to her left elbow more than any other pain. Patient has minor pain to her face. Patient rates her elbow pain at 7 out of 10, achy, nonradiating. States that her pain is only present upon movement of her elbow. Patient is pain-free when she is sitting still. Patient denies LOC, anticoagulation, nausea/vomiting, neck/back pain, difficulty breathing, visual disturbances, dizziness, or any other injuries or complaints.   Past Medical History  Diagnosis Date  . MRSA (methicillin resistant Staphylococcus aureus)   . Osteopenia   . Hemorrhoids   . Diverticulosis   . Hypertension    Past Surgical History  Procedure Laterality Date  . Hemorrhoid surgery     Family History  Problem Relation Age of Onset  . Osteoporosis Mother   . Dementia Mother   . Heart disease Father     pacemaker heart valve replacement   Social History  Substance Use Topics  . Smoking status: Never Smoker   . Smokeless tobacco: Never Used  . Alcohol Use: No     Comment: occasionally   OB History    No data available     Review of Systems  Respiratory: Negative for shortness of breath.   Cardiovascular: Negative for chest pain.  Gastrointestinal: Negative for nausea and vomiting.  Musculoskeletal: Positive for arthralgias (Left elbow). Negative for back pain and neck pain.  Skin: Positive for wound (Bridge  of the nose and top lip).  Neurological: Negative for dizziness, weakness, light-headedness, numbness and headaches.  All other systems reviewed and are negative.     Allergies  Penicillins  Home Medications   Prior to Admission medications   Medication Sig Start Date End Date Taking? Authorizing Provider  amLODipine (NORVASC) 5 MG tablet Take 1 tablet (5 mg total) by mouth daily. 11/26/14 11/26/15  Lanice Shirts, MD  aspirin EC 81 MG tablet Take 81 mg by mouth daily.      Historical Provider, MD  b complex vitamins capsule Take 1 capsule by mouth daily.      Historical Provider, MD  Calcium Carbonate-Vitamin D (CALTRATE 600+D) 600-400 MG-UNIT per tablet Take 1 tablet by mouth daily.      Historical Provider, MD  cephALEXin (KEFLEX) 500 MG capsule Take 1 capsule (500 mg total) by mouth 4 (four) times daily. 12/15/15   Denita Lun C Braelee Herrle, PA-C  co-enzyme Q-10 30 MG capsule Take 30 mg by mouth daily.      Historical Provider, MD  Cyanocobalamin (VITAMIN B-12 CR PO) Take 1 tablet by mouth daily.      Historical Provider, MD  GRAPE SEED CR PO Take 1 tablet by mouth daily.      Historical Provider, MD  ibuprofen (ADVIL,MOTRIN) 800 MG tablet Take 1 tablet (800 mg total) by mouth 3 (three) times daily. 12/15/15   Kaylei Frink C Demitri Kucinski, PA-C  Multiple Vitamin (MULTIVITAMIN) tablet Take 1 tablet by mouth daily.  Historical Provider, MD  oxyCODONE-acetaminophen (PERCOCET/ROXICET) 5-325 MG tablet Take 1-2 tablets by mouth every 4 (four) hours as needed for severe pain. 12/15/15   Miquela Costabile C Raneen Jaffer, PA-C  senna-docusate (STOOL SOFTENER & LAXATIVE) 8.6-50 MG per tablet Take 1 tablet by mouth daily.    Historical Provider, MD   BP 135/67 mmHg  Pulse 102  Temp(Src) 98.4 F (36.9 C) (Oral)  Resp 20  Ht 5\' 4"  (1.626 m)  Wt 78.472 kg  BMI 29.68 kg/m2  SpO2 99% Physical Exam  Constitutional: She is oriented to person, place, and time. She appears well-developed and well-nourished. No distress.  HENT:  Head:  Normocephalic and atraumatic.  Right Ear: External ear normal.  Left Ear: External ear normal.  Mouth/Throat: Oropharynx is clear and moist.  Swelling and tenderness noted to the bridge of the nose with a small 0.25 cm laceration with bleeding controlled. Nares are patent without hemorrhage. No instability of the nasal bone. No deformity, instability, or crepitus noted to the facial or skull bones. No raccoon's eyes or battle signs. Dentition is intact and all teeth are present.  Eyes: Conjunctivae and EOM are normal. Pupils are equal, round, and reactive to light.  Globes are intact without any obvious signs of injury.  Neck: Normal range of motion. Neck supple.  Cardiovascular: Normal rate, regular rhythm, normal heart sounds and intact distal pulses.   Pulmonary/Chest: Effort normal and breath sounds normal. No respiratory distress.  Abdominal: Soft. Bowel sounds are normal. There is no tenderness.  Musculoskeletal: She exhibits no edema or tenderness.  Full ROM in all extremities and spine. No paraspinal tenderness. Overall trauma exam reveals no abnormalities other than those listed here.  Neurological: She is alert and oriented to person, place, and time. She has normal reflexes.  No sensory deficits. Strength 5/5 in all extremities. No gait disturbance. Coordination intact. Cranial nerves III-XII grossly intact. No facial droop.   Skin: Skin is warm and dry. She is not diaphoretic.  Psychiatric: She has a normal mood and affect. Her behavior is normal.  Nursing note and vitals reviewed.   ED Course  .Marland KitchenLaceration Repair Date/Time: 12/15/2015 5:26 PM Performed by: Lorayne Bender Authorized by: Lorayne Bender Consent: Verbal consent obtained. Risks and benefits: risks, benefits and alternatives were discussed Consent given by: patient Patient understanding: patient states understanding of the procedure being performed Patient consent: the patient's understanding of the procedure matches  consent given Procedure consent: procedure consent matches procedure scheduled Patient identity confirmed: verbally with patient and arm band Time out: Immediately prior to procedure a "time out" was called to verify the correct patient, procedure, equipment, support staff and site/side marked as required. Body area: head/neck Location details: nose Laceration length: 0.3 cm Foreign bodies: no foreign bodies Tendon involvement: none Nerve involvement: none Vascular damage: no Patient sedated: no Preparation: Patient was prepped and draped in the usual sterile fashion. Irrigation solution: saline Irrigation method: syringe Amount of cleaning: standard Debridement: none Degree of undermining: none Skin closure: glue Approximation: close Approximation difficulty: simple Dressing: 4x4 sterile gauze Patient tolerance: Patient tolerated the procedure well with no immediate complications  .Splint Application Date/Time: 123XX123 5:26 PM Performed by: Lorayne Bender Authorized by: Lorayne Bender Consent: Verbal consent obtained. Risks and benefits: risks, benefits and alternatives were discussed Consent given by: patient Patient understanding: patient states understanding of the procedure being performed Patient consent: the patient's understanding of the procedure matches consent given Procedure consent: procedure consent matches procedure scheduled Patient identity  confirmed: verbally with patient and arm band Time out: Immediately prior to procedure a "time out" was called to verify the correct patient, procedure, equipment, support staff and site/side marked as required. Location details: left arm Splint type: long arm Supplies used: Ortho-Glass Post-procedure: The splinted body part was neurovascularly unchanged following the procedure. Patient tolerance: Patient tolerated the procedure well with no immediate complications Comments: The splint was applied by the Med Tech under my  supervision.   (including critical care time) Labs Review Labs Reviewed - No data to display  Imaging Review Dg Nasal Bones  12/15/2015  CLINICAL DATA:  Patient fell today. Pain and bleeding over the bridge of the nose. EXAM: NASAL BONES - 3+ VIEW COMPARISON:  None. FINDINGS: There is a nondisplaced fracture across the nasal bone with no significant comminution. There is no fracture depression. There is overlying soft tissue swelling. IMPRESSION: Nondisplaced fracture of the nasal bone. Electronically Signed   By: Lajean Manes M.D.   On: 12/15/2015 17:12   Dg Elbow Complete Left  12/15/2015  CLINICAL DATA:  Fall today. Left elbow pain, swelling, and bruising. Initial encounter. EXAM: LEFT ELBOW - COMPLETE 3+ VIEW COMPARISON:  None. FINDINGS: A comminuted radial head fracture is seen with displacement of a fracture fragment proximally. In addition, there are tiny ossific density seen along the lateral epicondyle, consistent tiny avulsion fracture fragments. No evidence of dislocation. IMPRESSION: Mildly comminuted and displaced radial head fracture. Tiny avulsion fracture fragments from the lateral epicondyle. Electronically Signed   By: Earle Gell M.D.   On: 12/15/2015 17:13   I have personally reviewed and evaluated these images and lab results as part of my medical decision-making.   EKG Interpretation None      MDM   Final diagnoses:  Radial head fracture, closed, left, initial encounter  Nasal fracture, closed, initial encounter    Jyoti A Welshans presents for evaluation following a fall just prior to arrival.  Findings and plan of care discussed with Veryl Speak, MD.  Patient has no symptoms of severe head injury. Patient has no focal neurologic deficits and no functional deficits. Patient found to have a nondisplaced nasal bone fracture as well as a mildly comminuted and displaced radial head fracture. Patient was offered something for her pain, but declined. Patient was  told to notify the nurse should she change her mind. Patient's small laceration on the bridge of her nose is very superficial. It is not suspected that this is an open fracture. Regardless, patient will be placed on an antibiotic and advised to follow up with ENT. Patient will also follow-up with orthopedics outpatient for her radial head fracture. The patient was given instructions for home care as well as return precautions. Patient voices understanding of these instructions, accepts the plan, and is comfortable with discharge.  Lorayne Bender, PA-C 12/15/15 2214  Veryl Speak, MD 12/15/15 [phone]

## 2015-12-15 NOTE — ED Notes (Signed)
Patient preparing for discharge. 

## 2015-12-15 NOTE — ED Notes (Signed)
She tripped and fell. Facial injury and left elbow injury. Her nose is deformed and swollen. Abrasions noted.

## 2015-12-15 NOTE — Discharge Instructions (Signed)
You have been seen today for injuries from a fall. Follow up with PCP as needed. Return to ED should symptoms worsen. Follow-up with orthopedics as soon as possible to address fracture in your elbow. Follow-up with ENT should you begin to have difficulty breathing through your nose or have any other concerns about the nasal fracture. Please take all of your antibiotics until finished!   You may develop abdominal discomfort or diarrhea from the antibiotic.  You may help offset this with probiotics which you can buy or get in yogurt. Do not eat or take the probiotics until 2 hours after your antibiotic.

## 2015-12-18 ENCOUNTER — Other Ambulatory Visit (HOSPITAL_BASED_OUTPATIENT_CLINIC_OR_DEPARTMENT_OTHER): Payer: Medicare Other

## 2016-09-21 ENCOUNTER — Other Ambulatory Visit (HOSPITAL_BASED_OUTPATIENT_CLINIC_OR_DEPARTMENT_OTHER): Payer: Self-pay | Admitting: Family Medicine

## 2016-09-21 DIAGNOSIS — Z1231 Encounter for screening mammogram for malignant neoplasm of breast: Secondary | ICD-10-CM

## 2016-10-24 ENCOUNTER — Ambulatory Visit (HOSPITAL_BASED_OUTPATIENT_CLINIC_OR_DEPARTMENT_OTHER)
Admission: RE | Admit: 2016-10-24 | Discharge: 2016-10-24 | Disposition: A | Discharge status: Home / Self Care | Payer: Medicare Other | Source: Ambulatory Visit | Attending: Family Medicine | Admitting: Family Medicine

## 2016-10-24 DIAGNOSIS — Z1231 Encounter for screening mammogram for malignant neoplasm of breast: Secondary | ICD-10-CM | POA: Diagnosis present

## 2017-02-05 IMAGING — CR DG NASAL BONES 3+V
3 series · 3 of 3 positions shown · non-contrast
Comparison: None.

CLINICAL DATA: Patient fell today. Pain and bleeding over the
bridge of the nose.

EXAM:
NASAL BONES - 3+ VIEW

[w waters *]
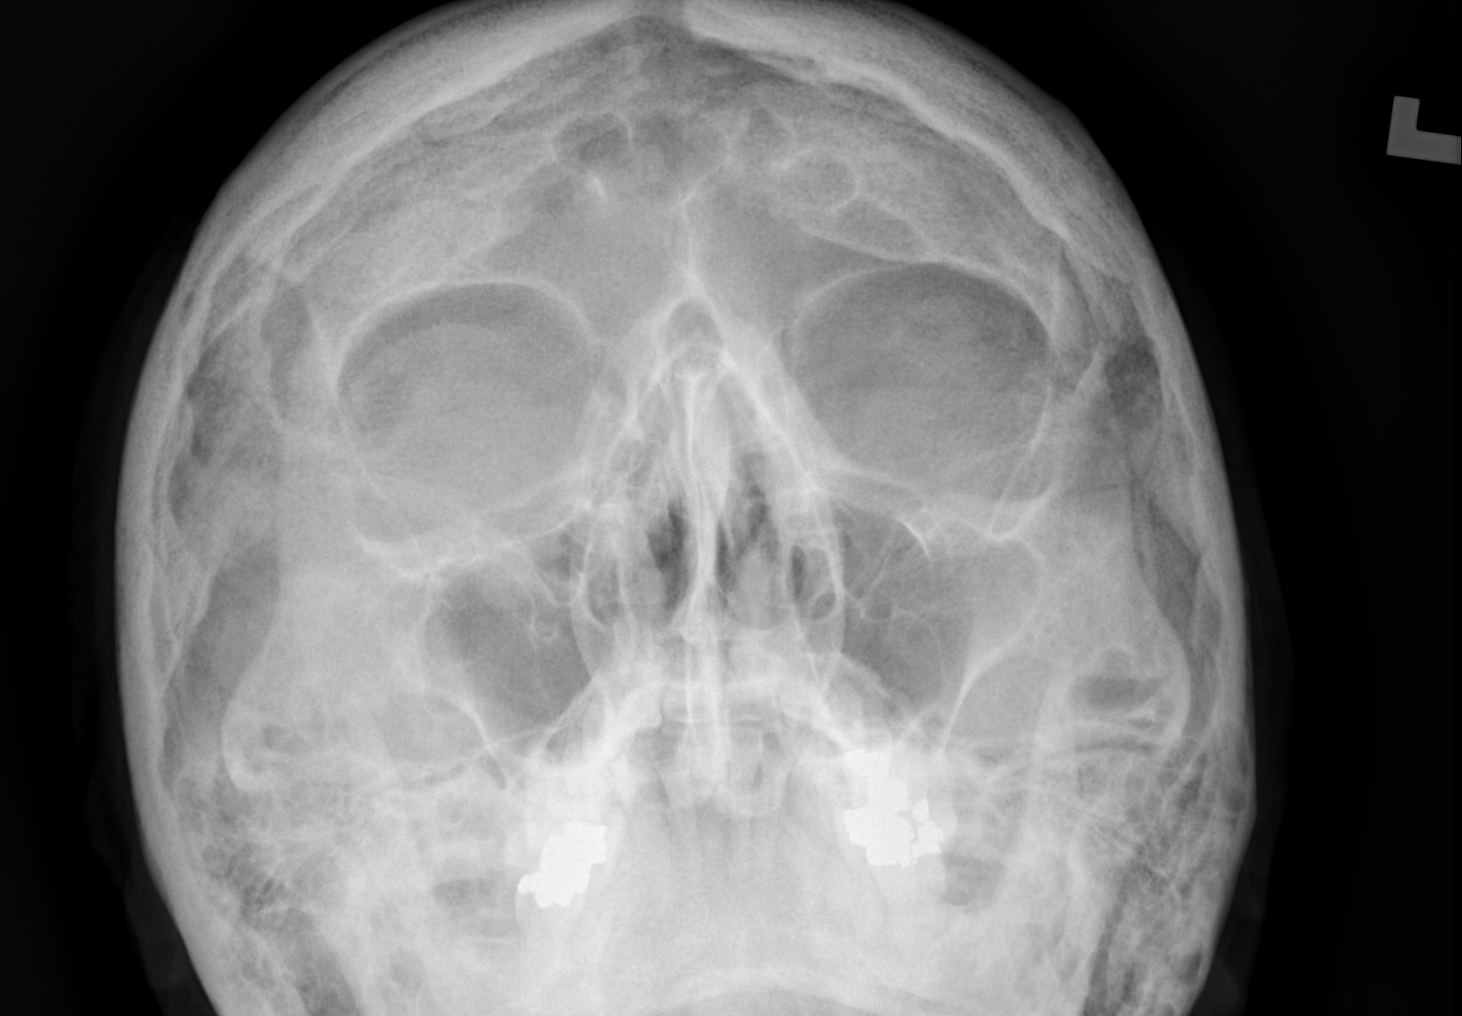

[w nasal bone lat * (1 of 2)]
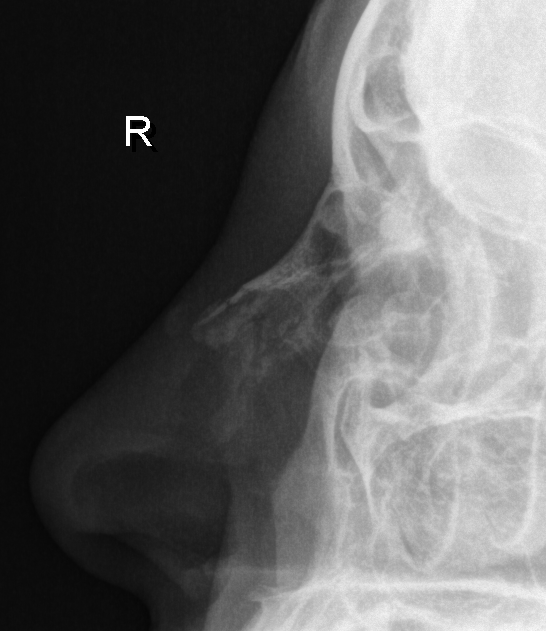

[w nasal bone lat * (2 of 2)]
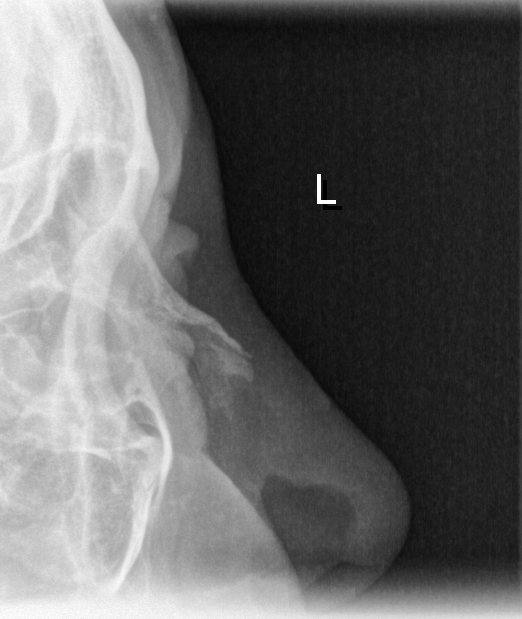

[3 of 3 positions shown; findings below may reference images not displayed]

FINDINGS: There is a nondisplaced fracture across the nasal bone with no
significant comminution. There is no fracture depression. There is
overlying soft tissue swelling.
IMPRESSION: Nondisplaced fracture of the nasal bone.

## 2017-02-05 IMAGING — CR DG ELBOW COMPLETE 3+V*L*
4 series · 4 of 4 positions shown · non-contrast
Comparison: None.

CLINICAL DATA: Fall today. Left elbow pain, swelling, and bruising.
Initial encounter.

EXAM:
LEFT ELBOW - COMPLETE 3+ VIEW

[x elbow joint ap left]
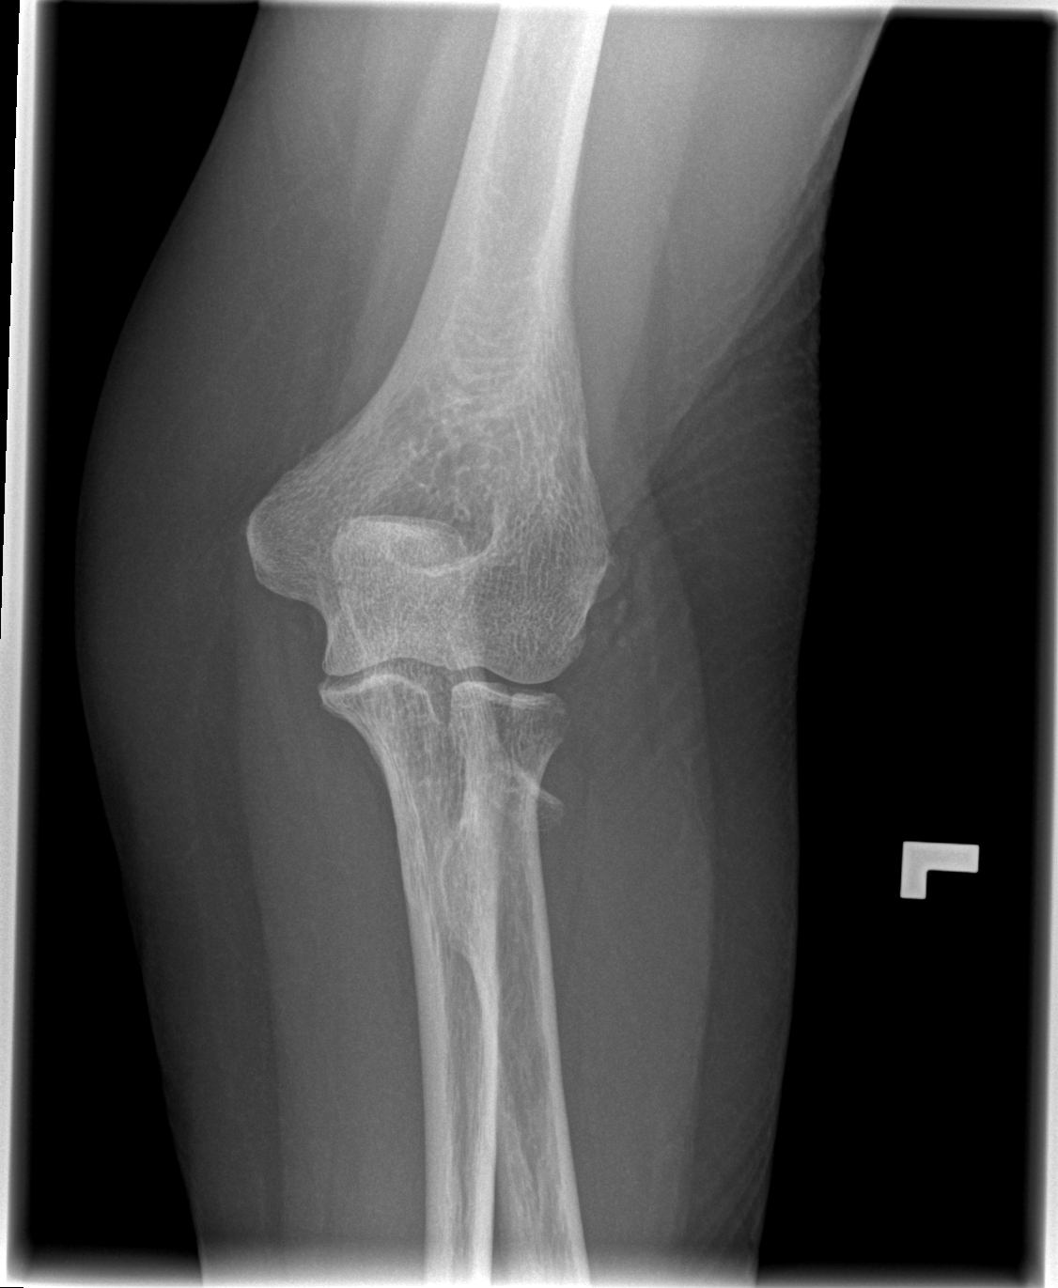

[x elbow joint obl. left (1 of 2)]
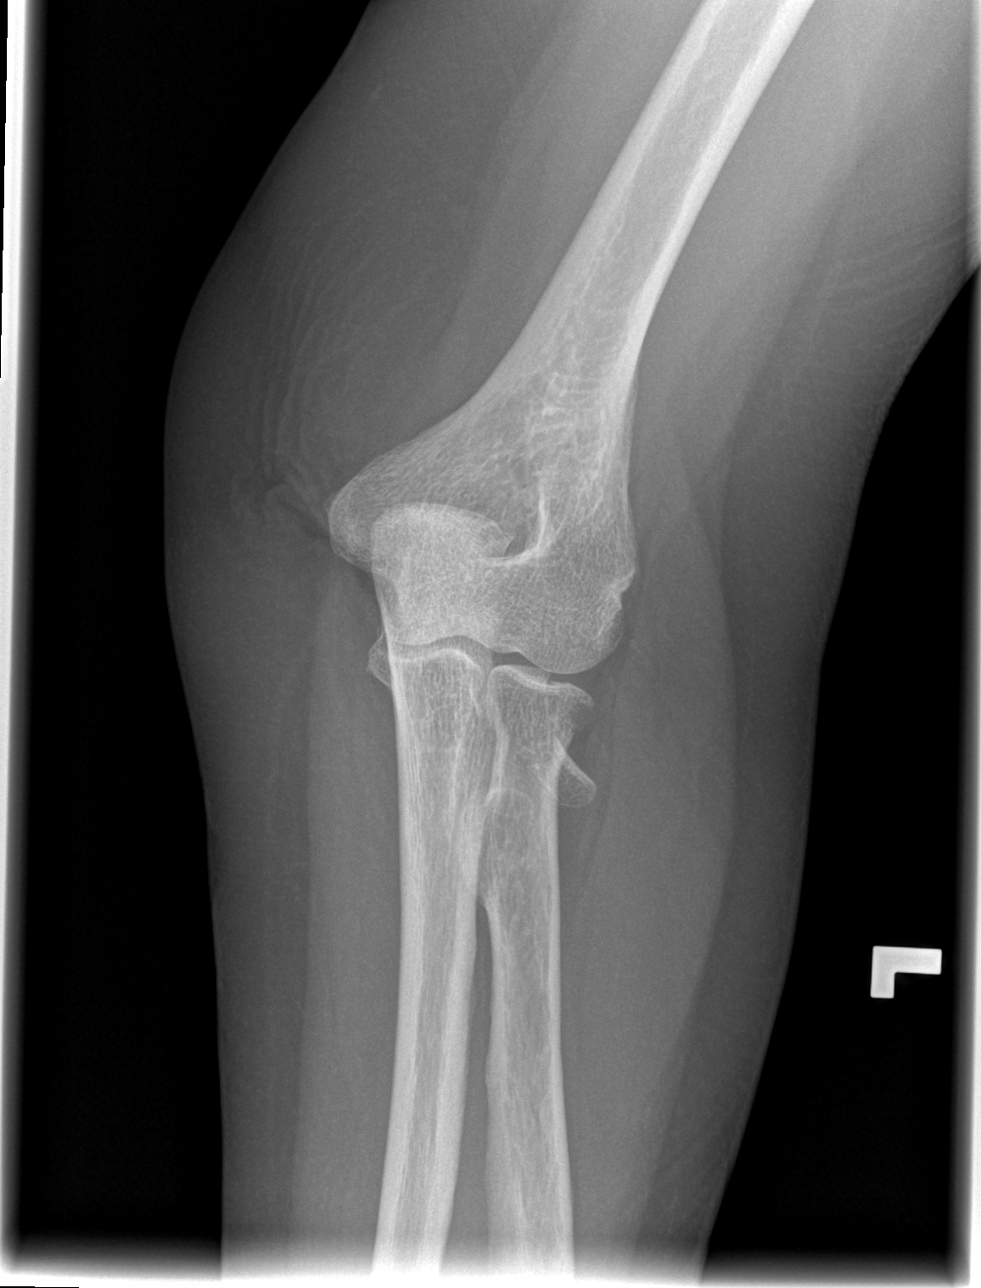

[x elbow joint obl. left (2 of 2)]
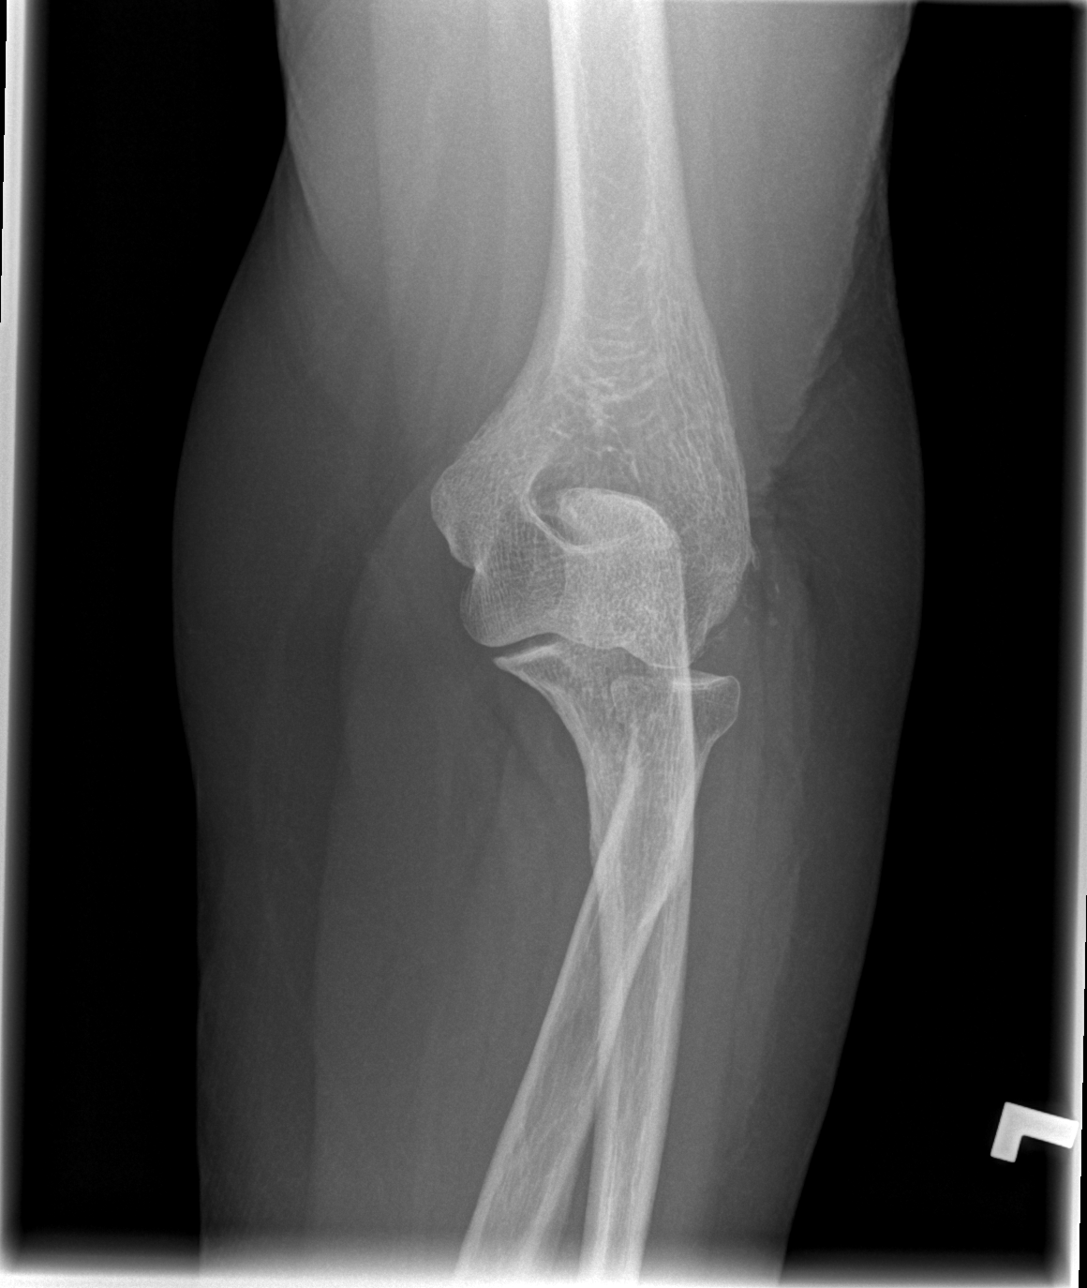

[x elbow joint lat left]
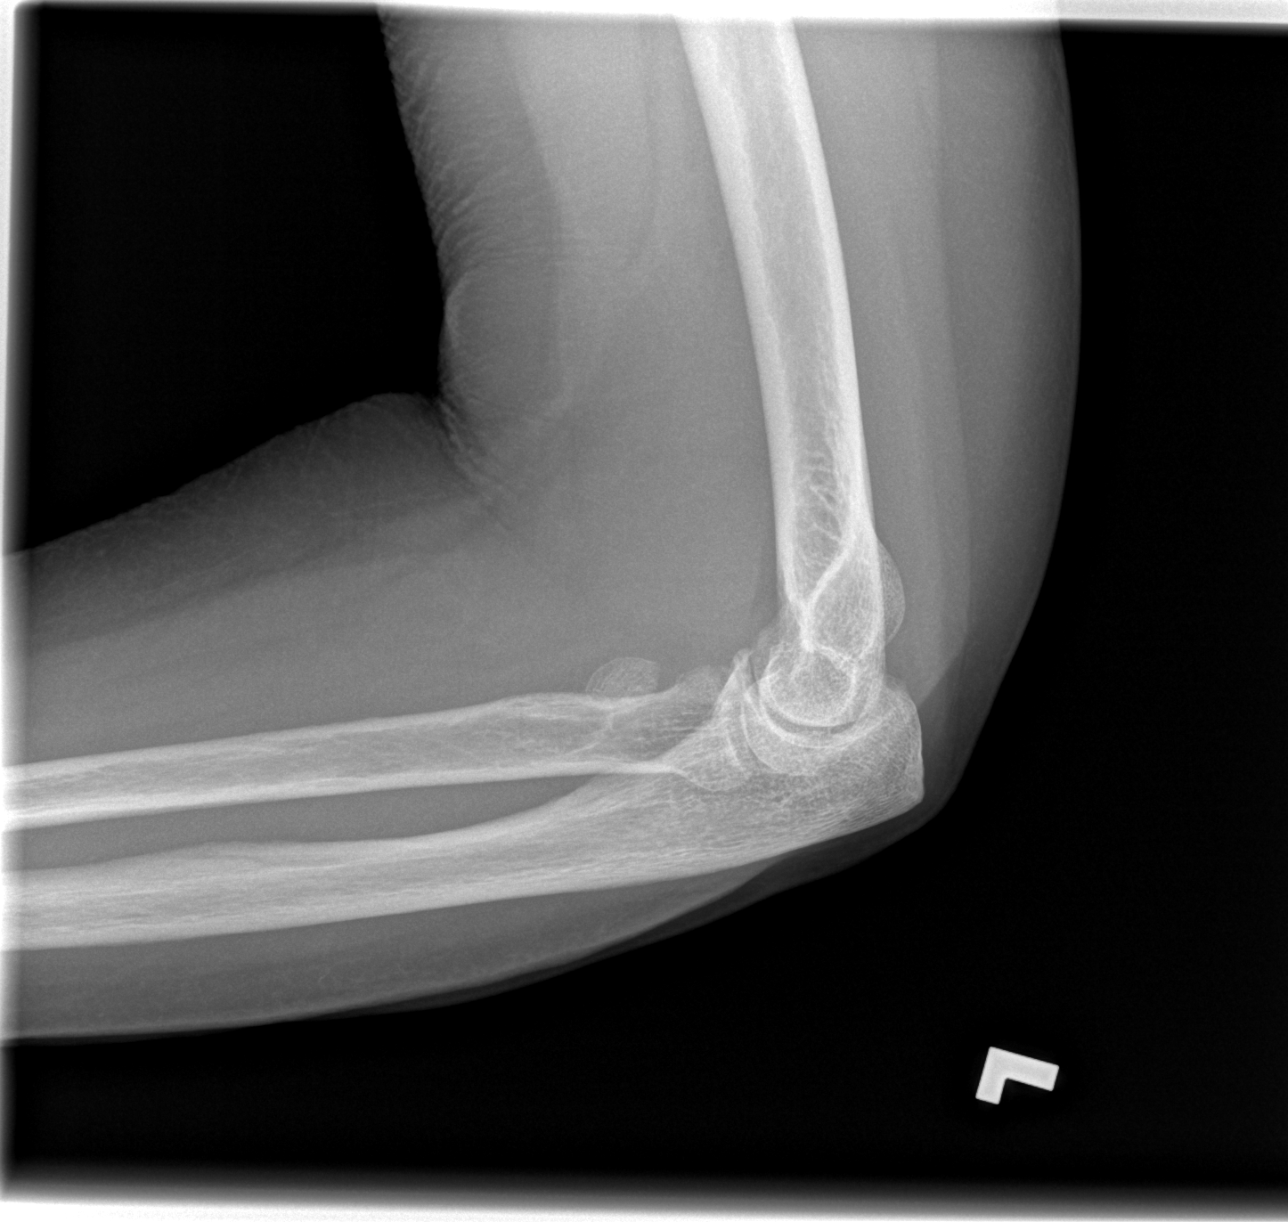

[4 of 4 positions shown; findings below may reference images not displayed]

FINDINGS: A comminuted radial head fracture is seen with displacement of a
fracture fragment proximally. In addition, there are tiny ossific
density seen along the lateral epicondyle, consistent tiny avulsion
fracture fragments. No evidence of dislocation.
IMPRESSION: Mildly comminuted and displaced radial head fracture.

Tiny avulsion fracture fragments from the lateral epicondyle.

## 2017-09-13 ENCOUNTER — Other Ambulatory Visit (HOSPITAL_BASED_OUTPATIENT_CLINIC_OR_DEPARTMENT_OTHER): Payer: Self-pay | Admitting: Family Medicine

## 2017-09-13 DIAGNOSIS — Z1231 Encounter for screening mammogram for malignant neoplasm of breast: Secondary | ICD-10-CM

## 2017-10-26 ENCOUNTER — Ambulatory Visit (HOSPITAL_BASED_OUTPATIENT_CLINIC_OR_DEPARTMENT_OTHER)
Admission: RE | Admit: 2017-10-26 | Discharge: 2017-10-26 | Disposition: A | Discharge status: Home / Self Care | Payer: Medicare Other | Source: Ambulatory Visit | Attending: Family Medicine | Admitting: Family Medicine

## 2017-10-26 ENCOUNTER — Encounter (HOSPITAL_BASED_OUTPATIENT_CLINIC_OR_DEPARTMENT_OTHER): Payer: Self-pay

## 2017-10-26 DIAGNOSIS — Z1231 Encounter for screening mammogram for malignant neoplasm of breast: Secondary | ICD-10-CM

## 2017-10-27 ENCOUNTER — Other Ambulatory Visit: Payer: Self-pay | Admitting: Family Medicine

## 2017-10-27 DIAGNOSIS — R928 Other abnormal and inconclusive findings on diagnostic imaging of breast: Secondary | ICD-10-CM

## 2017-11-17 ENCOUNTER — Ambulatory Visit
Admission: RE | Admit: 2017-11-17 | Discharge: 2017-11-17 | Disposition: A | Discharge status: Home / Self Care | Payer: Medicare Other | Source: Ambulatory Visit | Attending: Family Medicine | Admitting: Family Medicine

## 2017-11-17 ENCOUNTER — Ambulatory Visit: Payer: Medicare Other

## 2017-11-17 DIAGNOSIS — R928 Other abnormal and inconclusive findings on diagnostic imaging of breast: Secondary | ICD-10-CM

## 2018-10-29 ENCOUNTER — Other Ambulatory Visit: Payer: Self-pay | Admitting: Family Medicine

## 2018-10-29 DIAGNOSIS — Z1231 Encounter for screening mammogram for malignant neoplasm of breast: Secondary | ICD-10-CM

## 2018-12-05 ENCOUNTER — Ambulatory Visit
Admission: RE | Admit: 2018-12-05 | Discharge: 2018-12-05 | Disposition: A | Discharge status: Home / Self Care | Payer: Medicare Other | Source: Ambulatory Visit | Attending: Family Medicine | Admitting: Family Medicine

## 2018-12-05 DIAGNOSIS — Z1231 Encounter for screening mammogram for malignant neoplasm of breast: Secondary | ICD-10-CM

## 2018-12-19 ENCOUNTER — Other Ambulatory Visit: Payer: Self-pay | Admitting: Family Medicine

## 2018-12-19 DIAGNOSIS — M8580 Other specified disorders of bone density and structure, unspecified site: Secondary | ICD-10-CM

## 2019-02-13 ENCOUNTER — Other Ambulatory Visit: Payer: Medicare Other

## 2019-04-18 ENCOUNTER — Ambulatory Visit
Admission: RE | Admit: 2019-04-18 | Discharge: 2019-04-18 | Disposition: A | Discharge status: Home / Self Care | Payer: Medicare Other | Source: Ambulatory Visit | Attending: Family Medicine | Admitting: Family Medicine

## 2019-04-18 ENCOUNTER — Other Ambulatory Visit: Payer: Self-pay

## 2019-04-18 DIAGNOSIS — M8580 Other specified disorders of bone density and structure, unspecified site: Secondary | ICD-10-CM

## 2019-11-18 ENCOUNTER — Other Ambulatory Visit: Payer: Self-pay | Admitting: Family Medicine

## 2019-11-18 DIAGNOSIS — Z1231 Encounter for screening mammogram for malignant neoplasm of breast: Secondary | ICD-10-CM

## 2019-12-22 ENCOUNTER — Ambulatory Visit: Payer: Medicare Other

## 2019-12-26 ENCOUNTER — Ambulatory Visit
Admission: RE | Admit: 2019-12-26 | Discharge: 2019-12-26 | Disposition: A | Discharge status: Home / Self Care | Payer: Medicare Other | Source: Ambulatory Visit | Attending: Family Medicine | Admitting: Family Medicine

## 2019-12-26 ENCOUNTER — Other Ambulatory Visit: Payer: Self-pay

## 2019-12-26 DIAGNOSIS — Z1231 Encounter for screening mammogram for malignant neoplasm of breast: Secondary | ICD-10-CM

## 2020-01-07 ENCOUNTER — Ambulatory Visit: Payer: Medicare Other

## 2022-01-12 ENCOUNTER — Encounter: Payer: Self-pay | Admitting: Gastroenterology
